# Patient Record
Sex: Female | Born: 1965 | Race: White | Hispanic: No | Marital: Married | Smoking: Never smoker
Health system: Southern US, Community
[De-identification: ages and names within clinical notes are randomized; demographics above are authoritative.]

## PROBLEM LIST (undated history)

## (undated) DIAGNOSIS — E785 Hyperlipidemia, unspecified: Secondary | ICD-10-CM

## (undated) DIAGNOSIS — F411 Generalized anxiety disorder: Secondary | ICD-10-CM

## (undated) DIAGNOSIS — Z309 Encounter for contraceptive management, unspecified: Secondary | ICD-10-CM

## (undated) DIAGNOSIS — B9734 Human T-cell lymphotrophic virus, type II [HTLV-II] as the cause of diseases classified elsewhere: Secondary | ICD-10-CM

## (undated) HISTORY — DX: Hyperlipidemia, unspecified: E78.5

## (undated) HISTORY — DX: Generalized anxiety disorder: F41.1

## (undated) HISTORY — DX: Encounter for contraceptive management, unspecified: Z30.9

## (undated) HISTORY — DX: Human T-cell lymphotrophic virus, type II (HTLV-II) as the cause of diseases classified elsewhere: B97.34

---

## 1998-02-08 ENCOUNTER — Other Ambulatory Visit: Admission: RE | Admit: 1998-02-08 | Discharge: 1998-02-08 | Payer: Self-pay | Admitting: Obstetrics and Gynecology

## 1998-09-11 ENCOUNTER — Inpatient Hospital Stay (HOSPITAL_COMMUNITY): Admission: AD | Admit: 1998-09-11 | Discharge: 1998-09-14 | Payer: Self-pay | Admitting: Obstetrics and Gynecology

## 1999-06-05 ENCOUNTER — Other Ambulatory Visit: Admission: RE | Admit: 1999-06-05 | Discharge: 1999-06-05 | Payer: Self-pay | Admitting: Obstetrics and Gynecology

## 2000-06-11 ENCOUNTER — Other Ambulatory Visit: Admission: RE | Admit: 2000-06-11 | Discharge: 2000-06-11 | Payer: Self-pay | Admitting: Obstetrics and Gynecology

## 2001-03-16 ENCOUNTER — Other Ambulatory Visit: Admission: RE | Admit: 2001-03-16 | Discharge: 2001-03-16 | Payer: Self-pay | Admitting: Obstetrics and Gynecology

## 2001-05-14 ENCOUNTER — Encounter: Payer: Self-pay | Admitting: Obstetrics and Gynecology

## 2001-05-14 ENCOUNTER — Ambulatory Visit (HOSPITAL_COMMUNITY): Admission: RE | Admit: 2001-05-14 | Discharge: 2001-05-14 | Payer: Self-pay | Admitting: Obstetrics and Gynecology

## 2001-09-27 ENCOUNTER — Inpatient Hospital Stay (HOSPITAL_COMMUNITY): Admission: RE | Admit: 2001-09-27 | Discharge: 2001-09-30 | Payer: Self-pay | Admitting: Obstetrics and Gynecology

## 2001-10-01 ENCOUNTER — Encounter: Admission: RE | Admit: 2001-10-01 | Discharge: 2001-10-31 | Payer: Self-pay | Admitting: Obstetrics and Gynecology

## 2002-01-12 ENCOUNTER — Encounter (HOSPITAL_BASED_OUTPATIENT_CLINIC_OR_DEPARTMENT_OTHER): Payer: Self-pay | Admitting: General Surgery

## 2002-01-17 ENCOUNTER — Ambulatory Visit (HOSPITAL_COMMUNITY): Admission: RE | Admit: 2002-01-17 | Discharge: 2002-01-18 | Payer: Self-pay | Admitting: General Surgery

## 2002-03-29 ENCOUNTER — Other Ambulatory Visit: Admission: RE | Admit: 2002-03-29 | Discharge: 2002-03-29 | Payer: Self-pay | Admitting: Obstetrics and Gynecology

## 2003-05-17 ENCOUNTER — Other Ambulatory Visit: Admission: RE | Admit: 2003-05-17 | Discharge: 2003-05-17 | Payer: Self-pay | Admitting: *Deleted

## 2004-06-12 ENCOUNTER — Other Ambulatory Visit: Admission: RE | Admit: 2004-06-12 | Discharge: 2004-06-12 | Payer: Self-pay | Admitting: Obstetrics and Gynecology

## 2005-07-31 ENCOUNTER — Other Ambulatory Visit: Admission: RE | Admit: 2005-07-31 | Discharge: 2005-07-31 | Payer: Self-pay | Admitting: Obstetrics and Gynecology

## 2006-08-28 ENCOUNTER — Other Ambulatory Visit: Admission: RE | Admit: 2006-08-28 | Discharge: 2006-08-28 | Payer: Self-pay | Admitting: Obstetrics and Gynecology

## 2008-04-03 ENCOUNTER — Ambulatory Visit: Payer: Self-pay | Admitting: Infectious Disease

## 2008-04-03 DIAGNOSIS — E785 Hyperlipidemia, unspecified: Secondary | ICD-10-CM | POA: Insufficient documentation

## 2008-04-03 DIAGNOSIS — F411 Generalized anxiety disorder: Secondary | ICD-10-CM | POA: Insufficient documentation

## 2008-04-03 DIAGNOSIS — B9734 Human T-cell lymphotrophic virus, type II [HTLV-II] as the cause of diseases classified elsewhere: Secondary | ICD-10-CM

## 2008-09-02 ENCOUNTER — Emergency Department (HOSPITAL_COMMUNITY): Admission: EM | Admit: 2008-09-02 | Discharge: 2008-09-02 | Payer: Self-pay | Admitting: Emergency Medicine

## 2009-09-02 IMAGING — US US TRANSVAGINAL NON-OB
1 series · 14 of 25 positions shown · non-contrast
Comparison: None

CLINICAL DATA: Right-sided pelvic pain.

TRANSABDOMINAL AND TRANSVAGINAL ULTRASOUND OF PELVIS
DOPPLER ULTRASOUND OF OVARIES
TECHNIQUE: Both transabdominal and transvaginal ultrasound
examinations of the pelvis were performed including evaluation of
the uterus, ovaries, adnexal regions, and pelvic cul-de-sac. Color
and duplex Doppler ultrasound was utilized to evaluate blood flow
to the ovaries.

[Series 1: unknown · 0.30mm/px · 14 of 59 slices shown]
[im 1/59]
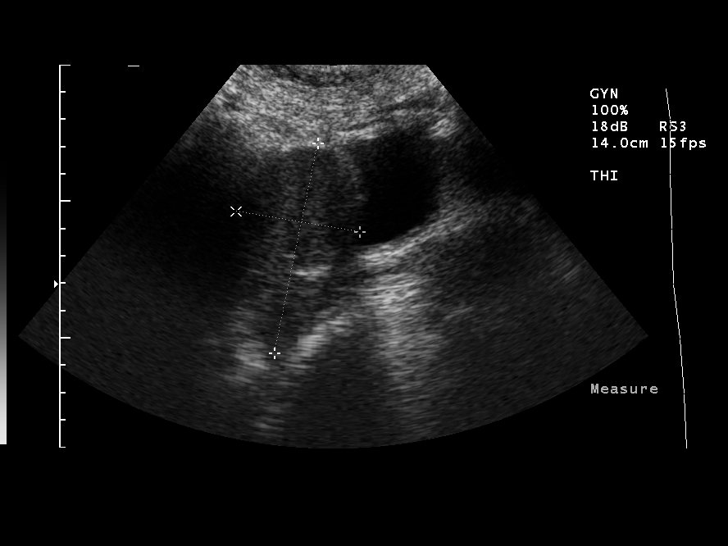
[im 5/59]
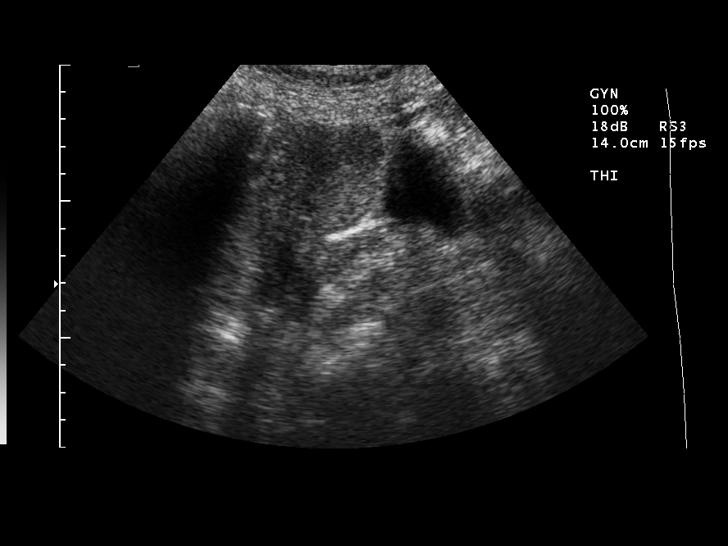
[im 10/59]
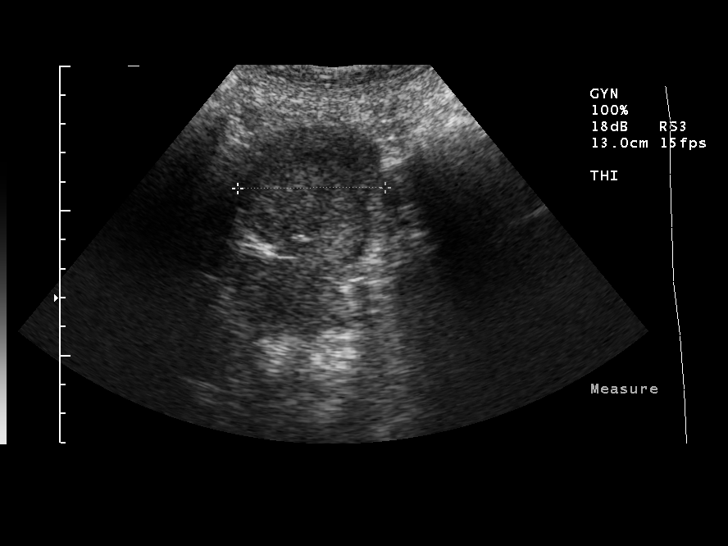
[im 15/59]
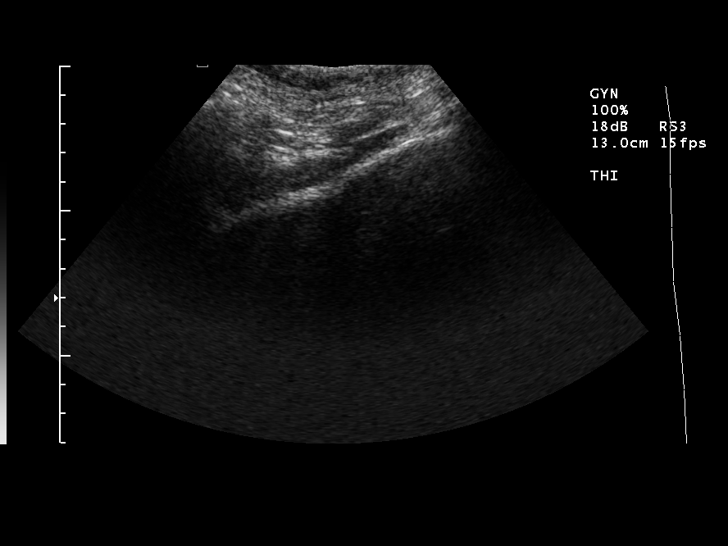
[im 20/59]
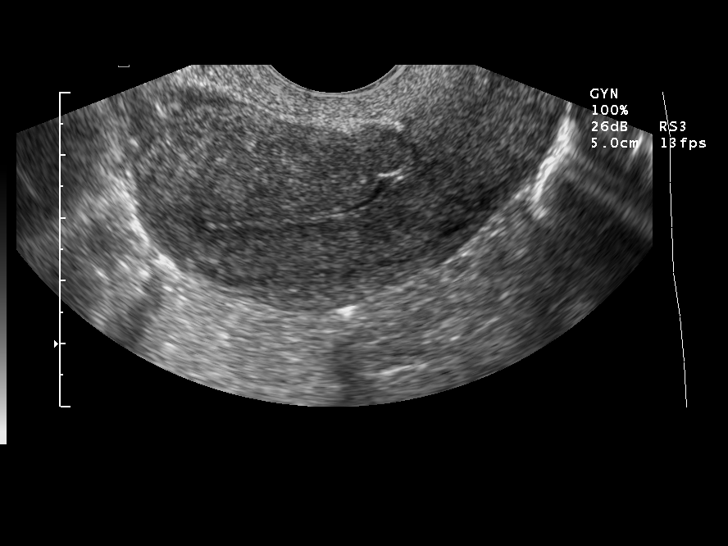
[im 22/59]
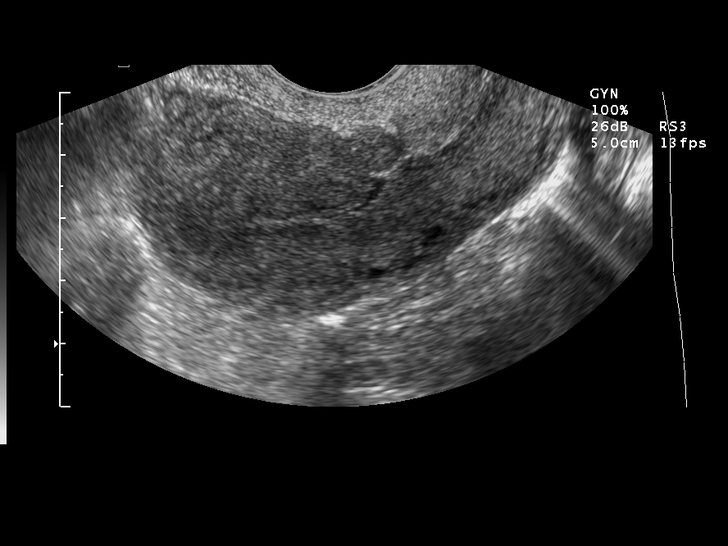
[im 27/59]
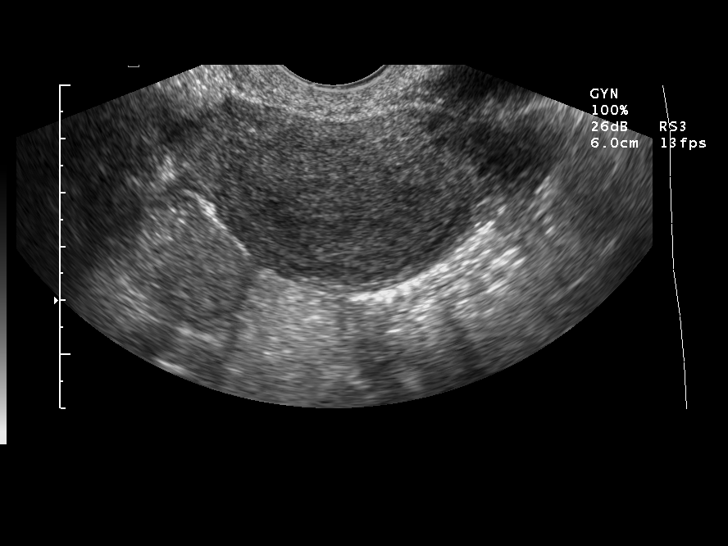
[im 32/59]
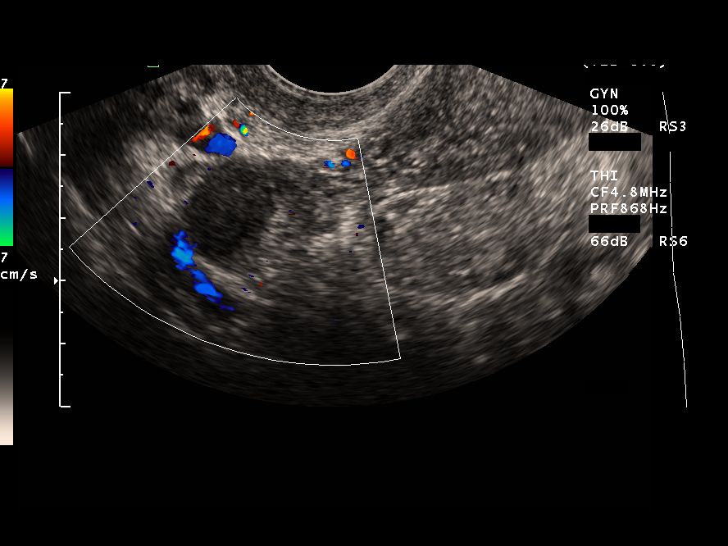
[im 37/59]
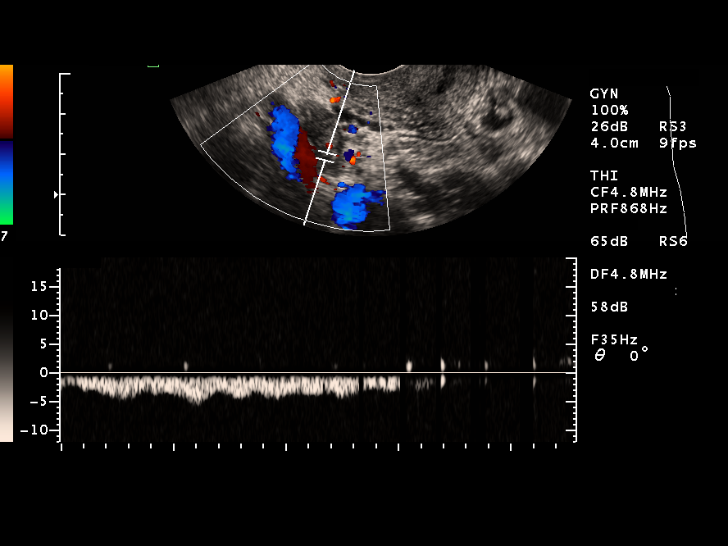
[im 39/59]
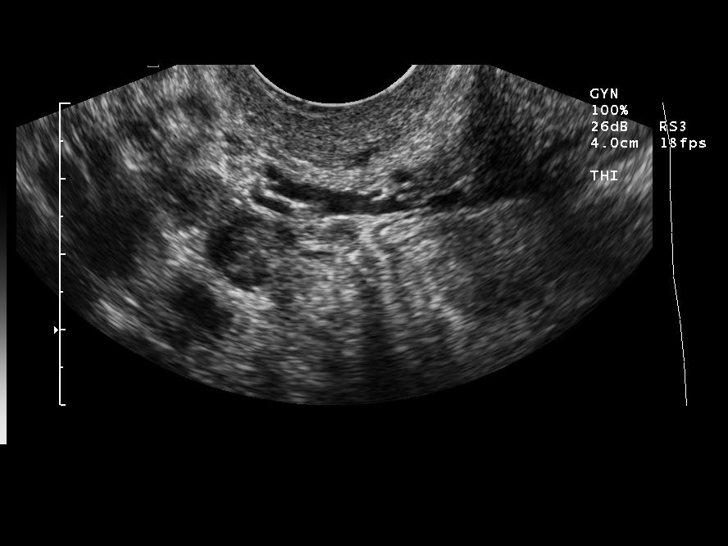
[im 44/59]
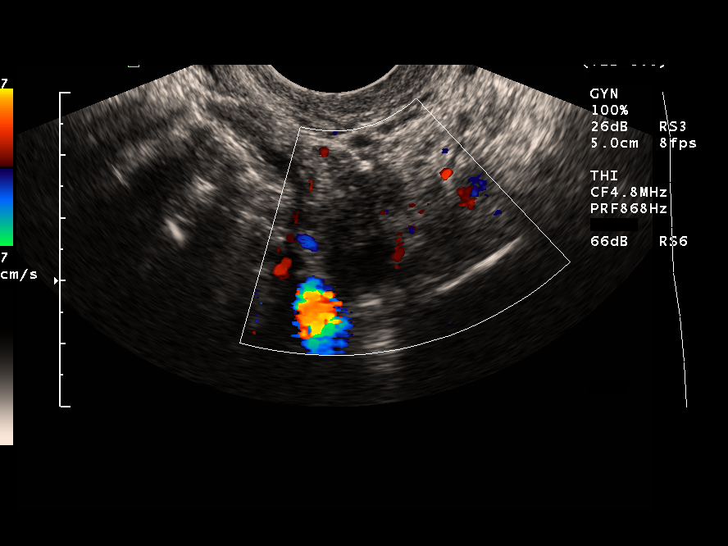
[im 49/59]
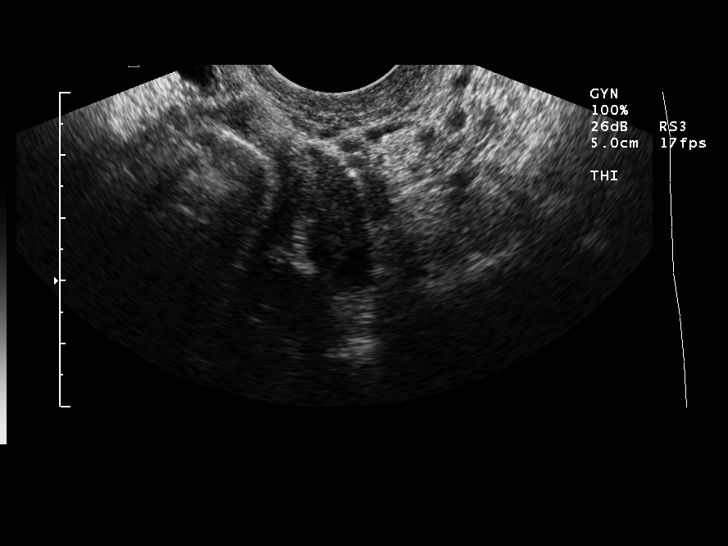
[im 54/59]
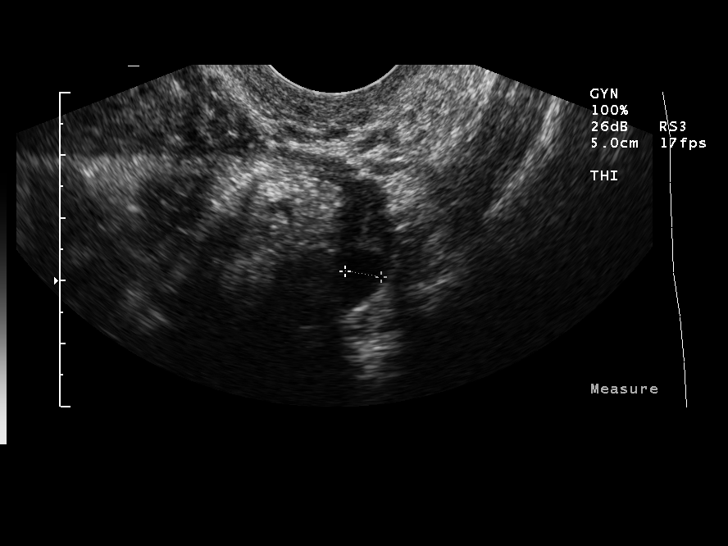
[im 59/59]
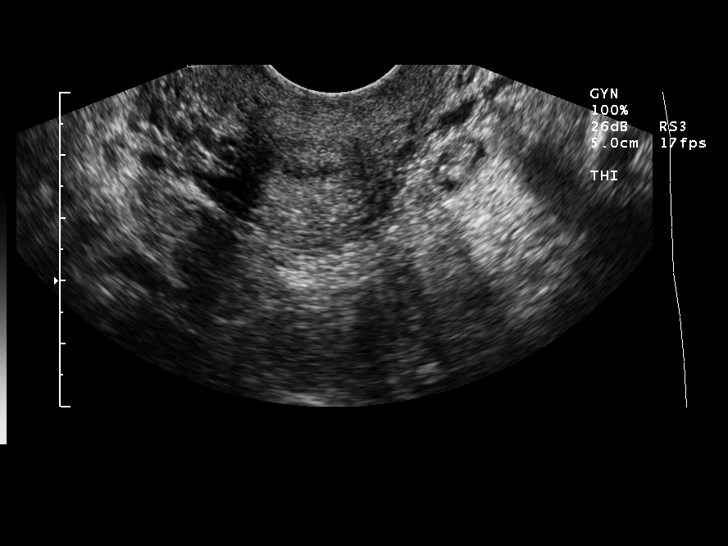

[14 of 25 positions shown; findings below may reference images not displayed]

FINDINGS: The uterus is anteverted measuring 8 x 4.5 x 5.1 cm.
No focal uterine lesions are identified.
The endometrial stripe is homogeneous measuring 4 mm in greatest
diameter.

The ovaries bilaterally are normal in size and echogenicity.
Normal arterial and venous flow and Doppler are noted in both
ovaries.

There is no evidence of adnexal mass or free fluid.
IMPRESSION: Normal pelvic ultrasound - no evidence of ovarian torsion.

## 2011-02-28 NOTE — H&P (Signed)
[facility]  Patient:    Brenda Martinez, Brenda Martinez Visit Number: [id number] MRN: [id number]          Service Type: Attending:  Janine Limbo, M.D. Dictated by:   Janine Limbo, M.D. Adm. Date:  09/27/01                           History and Physical  HISTORY OF PRESENT ILLNESS:   Ms. Lupinacci is a 45 year old female, gravida 2, para 1-0-0-1, who presents at [redacted] weeks gestation (EDC is October 12, 2001) for repeat cesarean section.  The patient has been followed at [facility] for this pregnancy that has been complicated by the fact that she has had a prior cesarean section.  Her age is actually greater than 35 as well.  OBSTETRICAL HISTORY:          In November 1999, the patient had a 7 pound 13 ounce female infant at 41-1/[redacted] weeks gestation by way of cesarean section.  DRUG ALLERGIES:               None known.  PAST MEDICAL HISTORY:         The patient denies hypertension and diabetes. She was told that she had gastroesophageal reflux disease in the past, but is not currently taking medication.  SOCIAL HISTORY:               The patient denies cigarette use, alcohol use and recreational drug use.  FAMILY HISTORY:               Noncontributory.  REVIEW OF SYSTEMS:            Normal pregnancy complaints.  PHYSICAL EXAMINATION:  VITAL SIGNS:                  Weight is 176 pounds.  HEENT:                        Within normal limits.  CHEST:                        Clear.  HEART:                        Regular rate and rhythm.  BREASTS:                      Without masses or tenderness.  ABDOMEN:                      Gravid with a fundal height of 36 cm.  EXTREMITIES:                  Within normal limits.  NEUROLOGIC EXAM:              Grossly normal.  PELVIC:                       The cervix is closed and long.  LABORATORY VALUES:            Blood type is O positive.  Antibody screen negative.  VDRL is  nonreactive.  Rubella is immune.  HBsAg is negative. Glucola screen is within normal limits.  Third trimester beta strep is negative.  ASSESSMENT:  1. A 38-week gestation.                               2. Prior cesarean section.                               3. Desires repeat cesarean section.  PLAN:                         The patient will undergo a repeat low transverse cesarean section.  She understands the indications for her procedure and she accepts the risks of, but not limited to, anesthetic complications, bleeding, infections and possible damage to the surrounding organs. Dictated by:   Janine Limbo, M.D. Attending:  Janine Limbo, M.D. DD:  09/27/01 TD:  09/27/01 Job: [id number]

## 2011-02-28 NOTE — Discharge Summary (Signed)
[facility]  Patient:    JIALI, LINNEY Visit Number: [id number] MRN: [id number]          Service Type: OBS Location: 910A 9139 01 Attending Physician:  Leonard Schwartz Dictated by:   Nigel Bridgeman, C.N.M. Admit Date:  09/27/2001 Discharge Date: 09/30/2001                             Discharge Summary  ADMISSION DIAGNOSES: 1. Term pregnancy. 2. Previous cesarean section. 3. Desires repeat.  DISCHARGE DIAGNOSES: 1. Term pregnancy. 2. Previous cesarean section. 3. Desires repeat.  PROCEDURE: 1. Repeat low transverse cesarean section. 2. Spinal anesthesia.  HOSPITAL COURSE:  Ms. Odonnel is a 45 year old, gravida 2, para 1-0-0-1, who was admitted at [redacted] weeks gestation for repeat cesarean section.  The patients pregnancy had been remarkable for previous cesarean section, advanced maternal age with amnio declined.  She was taken to the operating room on 09/27/01, where Dr. Stefano Gaul performed a repeat low transverse cesarean section under spinal anesthesia.  Findings were an 8 pound 10 ounce female by the name of Earlene Plater.  Apgars were 9 and 9.  There were normal uterus, tubes, and ovaries noted.  Estimated blood loss was 700 cc.  The patient tolerated the procedure well, and was taken to the recovery room in good condition.  The infant was taken to the full term nursery in good condition.  On postoperative day #1, the patient was doing well, she was breast-feeding, she was planning Micronor.  Vital signs were within normal limits.  She was afebrile.  Hemoglobin was 11.4.  White blood cell count was 9.1.  The rest of her hospital stay was essentially uncomplicated.  On the day of discharge, her incision was within normal limits.  There were some areas of irritation right at the point of insertion of her staples, but edges were well approximated and no evidence of infection was noted.  The staples were removed.  The area was cleaned with 50%  solution of hydrogen peroxide and sterile water, and Steri-Strips were applied.  The rest of the patients physical examination was within normal limits.  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  She was deemed to have received full benefit of her hospital stay, and was discharged home.  DISCHARGE INSTRUCTIONS:  Presented [place] OB handout.  DISCHARGE MEDICATIONS: 1. Motrin 600 mg p.o. q.6h. p.r.n. pain. 2. Tylox one or two p.o. q.3-4h. p.r.n. pain. 3. Micronor one p.o. q.d.  FOLLOWUP:  In six weeks at [facility]. Dictated by:   Nigel Bridgeman, C.N.M. Attending Physician:  Leonard Schwartz DD:  09/30/01 TD:  10/01/01 Job: [id number]

## 2011-02-28 NOTE — Op Note (Signed)
[place]  Patient:    Brenda Martinez, Brenda Martinez Visit Number: [id number] MRN: [id number]          Service Type: DSU Location: [facility] 2899 22 Attending Physician:  Sonda Primes Dictated by:   Mardene Celeste Lurene Shadow, M.D. Proc. Date: 01/17/02 Admit Date:  01/17/2002                             Operative Report  PREOPERATIVE DIAGNOSIS:  Ventral hernia.  POSTOPERATIVE DIAGNOSIS:  Ventral hernia.  OPERATION PERFORMED:  Repair of ventral hernia with mesh.  SURGEON:  Mardene Celeste. Lurene Shadow, M.D.  ASSISTANT:  Nurse.  ANESTHESIA:  General.  INDICATIONS FOR PROCEDURE:  The patient is a 45 year old woman who has had previous cesarean section in the past and presents now with a right lower quadrant mass which readily prolapses on standing.  No associated symptoms of bowel obstructions.  She presents now for repair of a ventral hernia which has occurred at the midline through the Pfannenstiel incision.  DESCRIPTION OF PROCEDURE:  Following the full discussion of the risks and benefits of surgery for repair of ventral hernia, the patient has asked and had all her questions answered and comes now to surgery having given full consent.  Following the induction of satisfactory anesthesia with the patient positioned supinely, the abdomen was routinely prepped and draped to be included in a sterile operative field.  The hernia has occurred in the midline just midway between the umbilicus and the pubic tubercle and with with some extension of it towards the right lower quadrant.  We approached this through the old Pfannenstiel incision deepening this through the skin and subcutaneous tissues carrying it down to the anterior rectus fascia.  The anterior rectus fascia was raised up above the rectus muscles and dissection carried down to the hernia.  There was prolapse of the omentum between the rectus fascia with a large hernia extending for about 4.5 cm down  through the midline.  The edge of the hernia was then grasped and cleared.  The hernial defect was then closed with a running suture of 2-0 Vicryl, then an onlay patch of polypropylene mesh was laid on top of the defect  and then sutured in place first by placing traction sutures in the four quadrants of the mesh and then using a #1 running Novofil suture to suture the mesh to the rectus muscle.  Hemostasis was then assured and then the anterior rectus fascia was then brought down over the repair and closed with a running suture of 2-0 Vicryl.  The subcutaneous tissues closed with a running 3-0 Vicryl suture and the skin closed with running 4-0 Monocryl suture and then reinforced with Steri-Strips.  Sterile dressing applied.  Anesthetic reversed.  Patient removed from the operating room to the recovery room in stable condition having tolerated the procedure well. Dictated by:   Mardene Celeste. Lurene Shadow, M.D. Attending Physician:  Sonda Primes DD:  01/17/02 TD:  01/17/02 Job: [phone] [id number]

## 2011-02-28 NOTE — Op Note (Signed)
[facility]  Patient:    Brenda Martinez, Brenda Martinez Visit Number: [id number] MRN: [id number]          Service Type: OBS Location: 910A 9139 01 Attending Physician:  Leonard Schwartz Dictated by:   Janine Limbo, M.D. Proc. Date: 09/27/01 Admit Date:  09/27/2001                             Operative Report  PREOPERATIVE DIAGNOSES:       1. Term intrauterine pregnancy.                               2. Prior cesarean section.                               3. Desires repeat cesarean section.  POSTOPERATIVE DIAGNOSES:      1. Term intrauterine pregnancy.                               2. Prior cesarean section.                               3. Desires repeat cesarean section.  PROCEDURE:                    Repeat low transverse cesarean section.  SURGEON:                      Janine Limbo, M.D.  FIRST ASSISTANT:              Vance Gather Duplantis, C.N.M.  ANESTHESIA:                   Spinal.  DISPOSITION:                  [name] is a 45 year old female gravida 2, para 1-0-0-1 who presents at [redacted] weeks gestation for repeat cesarean section. She understands the indications for her procedure and she accepts the risks of, but not limited to, anesthetic complications, bleeding, infections, and possible damage to the surrounding organs.  FINDINGS:                     An 8 pound 10 ounce female infant (name currently unknown) was delivered from a cephalic position.  Apgars were 9 at one minute and 9 at five minutes.  The uterus, fallopian tubes, and ovaries were normal for the gravid state.  PROCEDURE:                    The patient was taken to the operating room where a spinal anesthetic was given.  The patients abdomen was prepped with multiple layers of Betadine as was the perineum.  A Foley catheter was placed in the bladder.  The patient was sterilely draped.  A low transverse incision was made in the abdomen and carried sharply through  the subcutaneous tissue, the fascia, and the anterior peritoneum.  An incision was made in the lower uterine segment and extended transversely.  The fetal head was delivered without difficulty.  The mouth and nose were suctioned.  The remainder of the infant was then delivered.  The cord was  clamped and cut and the infant was handed to the awaiting pediatric team.  Routine cord blood studies were obtained.  Placenta was removed.  The uterine cavity was cleaned of amniotic fluid, clotted blood, and membranes.  The uterine incision was closed using a running locking suture of 2-0 Vicryl.  Hemostasis was adequate.  The pelvis was vigorously irrigated.  The anterior peritoneum and the abdominal musculature were reapproximated in the midline using 2-0 Vicryl.  The abdominal musculature, the fascia, and the subcutaneous layer were irrigated. The fascia was closed using a running suture 0 Vicryl followed by three interrupted sutures of 0 Vicryl.  The subcutaneous layer was closed using a running suture of 2-0 Vicryl.  The skin was reapproximated using skin staples. Sponge, needle, and instrument counts were correct on two occasions.  The estimated blood loss for the procedure was 700 cc.  The patient tolerated her procedure well.  She was taken to the recovery room in stable condition.  The infant was taken to the full-term nursery in stable condition. Dictated by:   Janine Limbo, M.D. Attending Physician:  Leonard Schwartz DD:  09/27/01 TD:  09/27/01 Job: [phone]

## 2011-07-15 LAB — POCT I-STAT, CHEM 8
BUN: 10
Calcium, Ion: 1.2
Chloride: 106
Creatinine, Ser: 1
Glucose, Bld: 86
HCT: 46
Hemoglobin: 15.6 — ABNORMAL HIGH
Potassium: 4
Sodium: 140
TCO2: 25

## 2011-07-15 LAB — DIFFERENTIAL
Basophils Absolute: 0.1
Basophils Relative: 1
Eosinophils Absolute: 0.1
Eosinophils Relative: 1
Lymphocytes Relative: 28
Lymphs Abs: 2.1
Monocytes Absolute: 0.4
Monocytes Relative: 6
Neutro Abs: 4.7
Neutrophils Relative %: 64

## 2011-07-15 LAB — CBC
HCT: 45.7
Hemoglobin: 15.1 — ABNORMAL HIGH
MCHC: 33.1
MCV: 91.3
Platelets: 280
RBC: 5
RDW: 12.3
WBC: 7.4

## 2011-07-15 LAB — URINE MICROSCOPIC-ADD ON

## 2011-07-15 LAB — GC/CHLAMYDIA PROBE AMP, GENITAL
Chlamydia, DNA Probe: NEGATIVE
GC Probe Amp, Genital: NEGATIVE

## 2011-07-15 LAB — URINALYSIS, ROUTINE W REFLEX MICROSCOPIC
Bilirubin Urine: NEGATIVE
Glucose, UA: NEGATIVE
Hgb urine dipstick: NEGATIVE
Ketones, ur: NEGATIVE
Nitrite: NEGATIVE
Protein, ur: NEGATIVE
Specific Gravity, Urine: 1.024
Urobilinogen, UA: 0.2
pH: 5.5

## 2011-07-15 LAB — PREGNANCY, URINE: Preg Test, Ur: NEGATIVE

## 2011-07-15 LAB — WET PREP, GENITAL
Trich, Wet Prep: NONE SEEN
WBC, Wet Prep HPF POC: NONE SEEN
Yeast Wet Prep HPF POC: NONE SEEN

## 2012-02-17 ENCOUNTER — Encounter: Payer: Self-pay | Admitting: Obstetrics and Gynecology

## 2012-02-17 ENCOUNTER — Ambulatory Visit (INDEPENDENT_AMBULATORY_CARE_PROVIDER_SITE_OTHER): Payer: 59 | Admitting: Obstetrics and Gynecology

## 2012-02-17 VITALS — BP 128/78 | Temp 99.1°F | Resp 16 | Ht 69.0 in | Wt 169.0 lb

## 2012-02-17 DIAGNOSIS — Z309 Encounter for contraceptive management, unspecified: Secondary | ICD-10-CM | POA: Insufficient documentation

## 2012-02-17 DIAGNOSIS — Z3049 Encounter for surveillance of other contraceptives: Secondary | ICD-10-CM

## 2012-02-17 DIAGNOSIS — Z01419 Encounter for gynecological examination (general) (routine) without abnormal findings: Secondary | ICD-10-CM

## 2012-02-17 MED ORDER — LEVONORGESTREL-ETHINYL ESTRAD 0.15-30 MG-MCG PO TABS: 1.0000 | ORAL_TABLET | Freq: Every day | ORAL | Status: DC

## 2012-02-17 NOTE — Progress Notes (Signed)
Allergies: NKDA Contraception: Nordette Gravida: 2 Para: 2 Primary Doctor: Dr. Beverly Milch EXAM  Regular Periods yes   Monthly Breast Ex. yes Tetanus<10 years yes NI. Bladder Function yes Daily BM's yes Healthy Diet yes Calcium/Multi-vitamin yes Mammogram yes Date: 2013-Normal Smoker no How much? Alcohol Abuse no How much? Substance Abuse no How much? Exercise yes How much? 3 x's a week Seatbelts yes Abuse at Home no Stressful Work no Sigmoid/Colonoscopy in past 5 years no  Medical problems this year: None  PMH: None  FMH: None  ABDOMINAL/PELVIC PAIN no  VAGINAL DISCHARGE no  IRREGULAR BLEEDING no  MENOPAUSAL SYMPTOMS no

## 2012-02-17 NOTE — Assessment & Plan Note (Signed)
Brenda Martinez

## 2012-02-17 NOTE — Progress Notes (Signed)
Subjective:    Brenda Martinez is a 46 y.o. female, No obstetric history on file., who presents for an annual exam.  No issues, wants to continue Nordette.  Sons are doing well (younger with special needs).    History   Social History  . Marital Status: Married    Spouse Name: N/A    Number of Children: N/A  . Years of Education: N/A   Social History Main Topics  . Smoking status: Never Smoker   . Smokeless tobacco: Never Used  . Alcohol Use: No  . Drug Use: No  . Sexually Active: Yes    Birth Control/ Protection: Pill   Other Topics Concern  . None   Social History Narrative  . None    Menstrual cycle:   LMP: Patient's last menstrual period was 01/27/2012.           Cycle: regular every month without intermenstrual spotting and Regular, monthly with normal flow and no severe dysmenorrha  The following portions of the patient's history were reviewed and updated as appropriate: allergies, current medications, past family history, past medical history, past social history, past surgical history and problem list.  Review of Systems Pertinent items are noted in HPI. Breast:Negative for breast lump,nipple discharge or nipple retraction Gastrointestinal: Negative for abdominal pain, change in bowel habits or rectal bleeding Urinary:negative   Objective:    BP 128/78  Temp 99.1 F (37.3 C)  Resp 16  Ht 5\' 9"  (1.753 m)  Wt 169 lb (76.658 kg)  BMI 24.96 kg/m2  LMP 01/27/2012    Weight:  Wt Readings from Last 1 Encounters:  02/17/12 169 lb (76.658 kg)          BMI: Body mass index is 24.96 kg/(m^2).  General Appearance: Alert, appropriate appearance for age. No acute distress HEENT: Grossly normal Neck / Thyroid: Supple, no masses, nodes or enlargement Lungs: clear to auscultation bilaterally Back: No CVA tenderness Breast Exam: No dimpling, nipple retraction or discharge. No masses or nodes. and No masses or nodes.No dimpling, nipple retraction or  discharge. Cardiovascular: Regular rate and rhythm. S1, S2, no murmur Gastrointestinal: Soft, non-tender, no masses or organomegaly Pelvic Exam: Vulva and vagina appear normal. Bimanual exam reveals normal uterus and adnexa. Rectovaginal: not indicated and normal rectal, no masses Lymphatic Exam: Non-palpable nodes in neck, clavicular, axillary, or inguinal regions Skin: no rash or abnormalities Neurologic: Normal gait and speech, no tremor  Psychiatric: Alert and oriented, appropriate affect.   Wet Prep:not applicable Urinalysis:not applicable UPT: Not done   Assessment:    Normal gyn exam  Contraceptive management--on Nordette   Plan:    Mammogram (had in January) Pap smear Return annually or prn STD screening: declined Contraception:oral contraceptives (estrogen/progesterone)--Rx Nordette sent electronically to [facility] for 3 month supply, refills x 1 year.      Hellena Pridgen, VICKIMD

## 2012-02-20 LAB — PAP IG W/ RFLX HPV ASCU

## 2012-03-25 ENCOUNTER — Ambulatory Visit: Payer: Self-pay | Admitting: Obstetrics and Gynecology

## 2013-09-26 ENCOUNTER — Encounter: Payer: Self-pay | Admitting: Interventional Cardiology

## 2013-09-26 ENCOUNTER — Encounter: Payer: Self-pay | Admitting: *Deleted

## 2013-09-27 ENCOUNTER — Encounter (INDEPENDENT_AMBULATORY_CARE_PROVIDER_SITE_OTHER): Payer: Self-pay

## 2013-09-27 ENCOUNTER — Encounter: Payer: Self-pay | Admitting: Interventional Cardiology

## 2013-09-27 ENCOUNTER — Ambulatory Visit (INDEPENDENT_AMBULATORY_CARE_PROVIDER_SITE_OTHER): Payer: 59 | Admitting: Interventional Cardiology

## 2013-09-27 VITALS — BP 120/84 | HR 82 | Ht 69.0 in | Wt 174.0 lb

## 2013-09-27 DIAGNOSIS — Z0389 Encounter for observation for other suspected diseases and conditions ruled out: Secondary | ICD-10-CM

## 2013-09-27 DIAGNOSIS — Z8249 Family history of ischemic heart disease and other diseases of the circulatory system: Secondary | ICD-10-CM | POA: Insufficient documentation

## 2013-09-27 NOTE — Patient Instructions (Signed)
Your physician has requested that you have an exercise tolerance test. For further information please visit [email or web address]. Please also follow instruction sheet, as given.   

## 2013-09-27 NOTE — Progress Notes (Signed)
Patient ID: Brenda Martinez, female   DOB: 23-Aug-1966, 47 y.o.   MRN: [id number]     Patient ID: Brenda Martinez MRN: [id number] DOB/AGE: 1965/12/28 47 y.o.   Referring Physician Dr. Herb Grays, Haze Rushing FNP   Reason for Consultation Family h/o CAD  HPI:  47 y/o who has a family h/o CAD.  Father had CABG x 4 about 13 years ago.  He was 49 Years old.  He also had severe HTN and suffered a stroke.  Both of her grandfathers ad heart disease in their early 70s.  She has 2 sisters who have had any severe heart disease.   She walks regularly for exercise.  She also does some light cardio/weights.  No sx with that.   At the end of the summer, she had left leg pain  It can now be above her left knee.  She can have some throbbing as well.  She took some NSAIDs with some relief.    Current Outpatient Prescriptions  Medication Sig Dispense Refill  . atorvastatin (LIPITOR) 10 MG tablet Take 10 mg by mouth daily.       [name] buPROPion (WELLBUTRIN XL) 150 MG 24 hr tablet Take 150 mg by mouth daily.       [name] escitalopram (LEXAPRO) 20 MG tablet Take 20 mg by mouth daily.       . Levonorgestrel-Ethinyl Estrad (ALTAVERA PO) Take by mouth daily. Birth control      . Multiple Vitamin (MULTIVITAMIN) tablet Take 1 tablet by mouth daily.      [name] omeprazole (PRILOSEC) 40 MG capsule Take 40 mg by mouth daily.      . vitamin B-12 (CYANOCOBALAMIN) 250 MCG tablet Take 250 mcg by mouth daily.       No current facility-administered medications for this visit.   Past Medical History  Diagnosis Date  . HYPERLIPIDEMIA   . Contraceptive management   . 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 Other anxiety states     No family history on file.  History   Social History  . Marital Status: Married    Spouse Name: N/A    Number of Children: N/A  . Years of Education: N/A   Occupational History  . Not on file.   Social History Main Topics  . Smoking status: Never Smoker   . Smokeless tobacco: Never Used  .  Alcohol Use: No  . Drug Use: No  . Sexual Activity: Yes    Birth Control/ Protection: Pill   Other Topics Concern  . Not on file   Social History Narrative  . No narrative on file    No past surgical history on file.    (Not in a hospital admission)  Review of systems complete and found to be negative unless listed above .  No nausea, vomiting.  No fever chills, No focal weakness,  No palpitations.  Physical Exam: Filed Vitals:   09/27/13 0926  BP: 120/84  Pulse: 82    Weight: 174 lb (78.926 kg)  Physical exam:  [place]/AT EOMI No JVD, No carotid bruit RRR S1S2  No wheezing Soft. NT, nondistended No edema. 2+ dorsalis pedis pulses bilaterally No focal motor or sensory deficits Normal affect  Labs:   Lab Results  Component Value Date   WBC 7.4 09/02/2008   HGB 15.6* 09/02/2008   HCT 46.0 09/02/2008   MCV 91.3 09/02/2008   PLT 280 09/02/2008   No results found for this basename: NA,  K, CL, CO2, BUN, CREATININE, CALCIUM, LABALBU, PROT, BILITOT, ALKPHOS, ALT, AST, GLUCOSE,  in the last 168 hours No results found for this basename: CKTOTAL,  CKMB,  CKMBINDEX,  TROPONINI    No results found for this basename: CHOL   No results found for this basename: HDL   No results found for this basename: LDLCALC   No results found for this basename: TRIG   No results found for this basename: CHOLHDL   No results found for this basename: LDLDIRECT       EKG: Normal  ASSESSMENT AND PLAN:   Family history of early CAD: Father with bypass surgery in his 59s. Both grandfathers with heart disease. We'll plan for exercise treadmill test to establish baseline.  She has not had any symptoms related to the cardiovascular system. Continue preventative therapy including lipid-lowering therapy. Continue diet control. Continue regular exercise.  Left Leg pain: She has a normal, left dorsalis pedis pulse.  I do not think her pain is vascular. It does not follow a pattern of  claudication.  Hyperlipidemia: Continue atorvastatin. Continue careful diet.  Signed:    Fredric Mare, MD, [facility] 09/27/2013, 10:07 AM

## 2013-10-05 ENCOUNTER — Ambulatory Visit (HOSPITAL_COMMUNITY)
Admission: RE | Admit: 2013-10-05 | Discharge: 2013-10-05 | Disposition: A | Discharge status: Home / Self Care | Payer: 59 | Source: Ambulatory Visit | Attending: Interventional Cardiology | Admitting: Interventional Cardiology

## 2013-10-05 DIAGNOSIS — Z8249 Family history of ischemic heart disease and other diseases of the circulatory system: Secondary | ICD-10-CM | POA: Insufficient documentation

## 2013-10-05 DIAGNOSIS — I10 Essential (primary) hypertension: Secondary | ICD-10-CM | POA: Insufficient documentation

## 2014-03-30 DIAGNOSIS — K2100 Gastro-esophageal reflux disease with esophagitis, without bleeding: Secondary | ICD-10-CM | POA: Insufficient documentation

## 2016-01-27 ENCOUNTER — Encounter (HOSPITAL_COMMUNITY): Payer: Self-pay | Admitting: Emergency Medicine

## 2016-01-27 ENCOUNTER — Emergency Department (HOSPITAL_COMMUNITY): Payer: 59

## 2016-01-27 ENCOUNTER — Emergency Department (HOSPITAL_COMMUNITY)
Admission: EM | Admit: 2016-01-27 | Discharge: 2016-01-27 | Disposition: A | Discharge status: Home / Self Care | Payer: 59 | Attending: Emergency Medicine | Admitting: Emergency Medicine

## 2016-01-27 DIAGNOSIS — Z793 Long term (current) use of hormonal contraceptives: Secondary | ICD-10-CM | POA: Insufficient documentation

## 2016-01-27 DIAGNOSIS — Z79899 Other long term (current) drug therapy: Secondary | ICD-10-CM | POA: Insufficient documentation

## 2016-01-27 DIAGNOSIS — R079 Chest pain, unspecified: Secondary | ICD-10-CM | POA: Diagnosis not present

## 2016-01-27 DIAGNOSIS — Z8619 Personal history of other infectious and parasitic diseases: Secondary | ICD-10-CM | POA: Diagnosis not present

## 2016-01-27 DIAGNOSIS — K219 Gastro-esophageal reflux disease without esophagitis: Secondary | ICD-10-CM | POA: Diagnosis not present

## 2016-01-27 DIAGNOSIS — E785 Hyperlipidemia, unspecified: Secondary | ICD-10-CM | POA: Insufficient documentation

## 2016-01-27 DIAGNOSIS — F419 Anxiety disorder, unspecified: Secondary | ICD-10-CM | POA: Diagnosis not present

## 2016-01-27 LAB — CBC
HEMATOCRIT: 41 % (ref 36.0–46.0)
HEMOGLOBIN: 13.2 g/dL (ref 12.0–15.0)
MCH: 28.9 pg (ref 26.0–34.0)
MCHC: 32.2 g/dL (ref 30.0–36.0)
MCV: 89.9 fL (ref 78.0–100.0)
Platelets: 214 10*3/uL (ref 150–400)
RBC: 4.56 MIL/uL (ref 3.87–5.11)
RDW: 12.7 % (ref 11.5–15.5)
WBC: 8.4 10*3/uL (ref 4.0–10.5)

## 2016-01-27 LAB — I-STAT TROPONIN, ED
TROPONIN I, POC: 0 ng/mL (ref 0.00–0.08)
Troponin i, poc: 0 ng/mL (ref 0.00–0.08)

## 2016-01-27 LAB — BASIC METABOLIC PANEL
Anion gap: 11 (ref 5–15)
BUN: 14 mg/dL (ref 6–20)
CHLORIDE: 108 mmol/L (ref 101–111)
CO2: 21 mmol/L — AB (ref 22–32)
Calcium: 9 mg/dL (ref 8.9–10.3)
Creatinine, Ser: 0.79 mg/dL (ref 0.44–1.00)
GFR calc Af Amer: >60 mL/min (ref >60)
GFR calc non Af Amer: >60 mL/min (ref >60)
GLUCOSE: 115 mg/dL — AB (ref 65–99)
POTASSIUM: 4 mmol/L (ref 3.5–5.1)
SODIUM: 140 mmol/L (ref 135–145)

## 2016-01-27 MED ORDER — ALUM & MAG HYDROXIDE-SIMETH 200-200-20 MG/5ML PO SUSP
30.0000 mL | Freq: Once | ORAL | Status: AC
  Administered 2016-01-27: 30 mL via ORAL
  Filled 2016-01-27: qty 30

## 2016-01-27 MED ORDER — RANITIDINE HCL 150 MG/10ML PO SYRP
300.0000 mg | ORAL_SOLUTION | Freq: Once | ORAL | Status: AC
  Administered 2016-01-27: 300 mg via ORAL
  Filled 2016-01-27: qty 20

## 2016-01-27 MED ORDER — METOCLOPRAMIDE HCL 10 MG PO TABS
10.0000 mg | ORAL_TABLET | Freq: Once | ORAL | Status: AC
  Administered 2016-01-27: 10 mg via ORAL
  Filled 2016-01-27: qty 1

## 2016-01-27 NOTE — Discharge Instructions (Signed)
Nonspecific Chest Pain Ms. Fromer, your blood work and EKG today were normal.  Please see your primary care physician within 3 days for close follow up. If symptoms worsen, come back to the ED immediately. Thank you.    It is often hard to find the cause of chest pain. There is always a chance that your pain could be related to something serious, such as a heart attack or a blood clot in your lungs. Chest pain can also be caused by conditions that are not life-threatening. If you have chest pain, it is very important to follow up with your doctor.  HOME CARE  If you were prescribed an antibiotic medicine, finish it all even if you start to feel better.  Avoid any activities that cause chest pain.  Do not use any tobacco products, including cigarettes, chewing tobacco, or electronic cigarettes. If you need help quitting, ask your doctor.  Do not drink alcohol.  Take medicines only as told by your doctor.  Keep all follow-up visits as told by your doctor. This is important. This includes any further testing if your chest pain does not go away.  Your doctor may tell you to keep your head raised (elevated) while you sleep.  Make lifestyle changes as told by your doctor. These may include:  Getting regular exercise. Ask your doctor to suggest some activities that are safe for you.  Eating a heart-healthy diet. Your doctor or a diet specialist (dietitian) can help you to learn healthy eating options.  Maintaining a healthy weight.  Managing diabetes, if necessary.  Reducing stress. GET HELP IF:  Your chest pain does not go away, even after treatment.  You have a rash with blisters on your chest.  You have a fever. GET HELP RIGHT AWAY IF:  Your chest pain is worse.  You have an increasing cough, or you cough up blood.  You have severe belly (abdominal) pain.  You feel extremely weak.  You pass out (faint).  You have chills.  You have sudden, unexplained chest  discomfort.  You have sudden, unexplained discomfort in your arms, back, neck, or jaw.  You have shortness of breath at any time.  You suddenly start to sweat, or your skin gets clammy.  You feel nauseous.  You vomit.  You suddenly feel light-headed or dizzy.  Your heart begins to beat quickly, or it feels like it is skipping beats. These symptoms may be an emergency. Do not wait to see if the symptoms will go away. Get medical help right away. Call your local emergency services (911 in the [place] Do not drive yourself to the hospital.   This information is not intended to replace advice given to you by your health care provider. Make sure you discuss any questions you have with your health care provider.   Document Released: 03/17/2008 Document Revised: 10/20/2014 Document Reviewed: 05/05/2014 Elsevier Interactive Patient Education [employer].

## 2016-01-27 NOTE — ED Provider Notes (Signed)
CSN: [id number]     Arrival date & time 01/27/16  0146 History   By signing my name below, I, Forrestine Him, attest that this documentation has been prepared under the direction and in the presence of Everlene Balls, MD.  Electronically Signed: Forrestine Him, ED Scribe. 01/27/2016. 2:03 AM.   Chief Complaint  Patient presents with  . Chest Pain   HPI  HPI Comments: AALIAYH BIDDULPH is a 50 y.o. female with a PMHx of reflux and hyperlipidemia who presents to the Emergency Department complaining of intermittent, ongoing, worsening L sided chest pain onset 6:00 PM this evening. Pain is described as burning. Discomfort is exacerbated with deep breathing without any alleviating factors. No OTC mediations attempted prior to arrival. However , 2 Nitro given en route to department without any relief. She denies any fever, chills, nausea, vomiting, or diaphoresis. Pt states symptoms feel different from discomfort associated with reflux. No recent long distance travel. She denies any recent surgeries. No known allergies to medications.  PCP: Florina Ou, MD    Past Medical History  Diagnosis Date  . HYPERLIPIDEMIA   . Contraceptive management   . 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 Other anxiety states    No past surgical history on file. Family History  Problem Relation Age of Onset  . Heart disease Father     CABG  . Heart attack Maternal Grandfather   . Heart attack Paternal Grandfather    Social History  Substance Use Topics  . Smoking status: Never Smoker   . Smokeless tobacco: Never Used  . Alcohol Use: No   OB History    No data available     Review of Systems  A complete 10 system review of systems was obtained and all systems are negative except as noted in the HPI and PMH.    Allergies  Review of patient's allergies indicates no known allergies.  Home Medications   Prior to Admission medications   Medication Sig Start Date End Date Taking? Authorizing Provider   atorvastatin (LIPITOR) 10 MG tablet Take 10 mg by mouth daily.  09/19/13   Historical Provider, MD  buPROPion (WELLBUTRIN XL) 150 MG 24 hr tablet Take 150 mg by mouth daily.  08/01/13   Historical Provider, MD  escitalopram (LEXAPRO) 20 MG tablet Take 20 mg by mouth daily.  09/18/13   Historical Provider, MD  Levonorgestrel-Ethinyl Estrad (ALTAVERA PO) Take by mouth daily. Birth control    Historical Provider, MD  Multiple Vitamin (MULTIVITAMIN) tablet Take 1 tablet by mouth daily.    Historical Provider, MD  omeprazole (PRILOSEC) 40 MG capsule Take 40 mg by mouth daily.    Historical Provider, MD  vitamin B-12 (CYANOCOBALAMIN) 250 MCG tablet Take 250 mcg by mouth daily.    Historical Provider, MD   Triage Vitals: Pulse 84  Temp(Src) 99.3 F (37.4 C) (Oral)  Ht 5\' 9"  (1.753 m)  Wt 160 lb (72.576 kg)  BMI 23.62 kg/m2  SpO2 97%   Physical Exam  Constitutional: She is oriented to person, place, and time. She appears well-developed and well-nourished. No distress.  HENT:  Head: Normocephalic and atraumatic.  Nose: Nose normal.  Mouth/Throat: Oropharynx is clear and moist. No oropharyngeal exudate.  Eyes: Conjunctivae and EOM are normal. Pupils are equal, round, and reactive to light. No scleral icterus.  Neck: Normal range of motion. Neck supple. No JVD present. No tracheal deviation present. No thyromegaly present.  Cardiovascular: Normal rate, regular rhythm and  normal heart sounds.  Exam reveals no gallop and no friction rub.   No murmur heard. Pulmonary/Chest: Effort normal and breath sounds normal. No respiratory distress. She has no wheezes. She exhibits no tenderness.  Abdominal: Soft. Bowel sounds are normal. She exhibits no distension and no mass. There is no tenderness. There is no rebound and no guarding.  Musculoskeletal: Normal range of motion. She exhibits no edema or tenderness.  Lymphadenopathy:    She has no cervical adenopathy.  Neurological: She is alert and oriented to  person, place, and time. No cranial nerve deficit. She exhibits normal muscle tone.  Skin: Skin is warm and dry. No rash noted. No erythema. No pallor.  Nursing note and vitals reviewed.   ED Course  Procedures (including critical care time)  DIAGNOSTIC STUDIES: Oxygen Saturation is 97% on RA, adequate by my interpretation.    COORDINATION OF CARE: 2:00 AM- Will order imaging, blood work, and EKG. Discussed treatment plan with pt at bedside and pt agreed to plan.     Labs Review Labs Reviewed  BASIC METABOLIC PANEL - Abnormal; Notable for the following:    CO2 21 (*)    Glucose, Bld 115 (*)    All other components within normal limits  CBC  I-STAT TROPOININ, ED  Randolm Idol, ED    Imaging Review Dg Chest 2 View  01/27/2016  CLINICAL DATA:  Initial evaluation for acute chest pain. EXAM: CHEST  2 VIEW COMPARISON:  None. FINDINGS: The cardiac and mediastinal silhouettes are within normal limits. Trach air column midline and patent. Lungs are mildly hypoinflated. Mild bibasilar atelectasis. No consolidative airspace disease. No pulmonary edema or pleural effusion. No pneumothorax. Incidental note made of an azygos lobe. No acute osseous abnormality. IMPRESSION: Shallow lung inflation with mild bibasilar atelectasis. No other active cardiopulmonary disease. Electronically Signed   By: Jeannine Boga M.D.   On: 01/27/2016 03:10   I have personally reviewed and evaluated these images and lab results as part of my medical decision-making.   EKG Interpretation   Date/Time:  Sunday January 27 2016 05:20:04 EDT Ventricular Rate:  83 PR Interval:  126 QRS Duration: 88 QT Interval:  371 QTC Calculation: 436 R Axis:   35 Text Interpretation:  Sinus rhythm No significant change since last  tracing Confirmed by Glynn Octave [phone]) on 01/27/2016 5:36:14 AM      MDM   Final diagnoses:  None    Patient presents emergency department for chest pain. She is low risk  for ACS. Will evaluate with heart score and 2 sets of troponins. Nitroglycerin did not help her pain get better. There is no exertional component to her pain. Heart score is currently 1 for her age. Troponin and EKG are negative.  5:43 AM Repeat EKG is unchanged and troponin is negative.  HEART score remains 1.  She appears well and in NAD.  PCP fu advised within 3 days.  VS remain within her normal limits and she is safe for DC.   I personally performed the services described in this documentation, which was scribed in my presence. The recorded information has been reviewed and is accurate.     Everlene Balls, MD 01/27/16 [phone]

## 2016-01-27 NOTE — ED Notes (Signed)
Pt. States she started having left sided chest pain last night around 6 pm at dinner. Pt. States if first started as burning pain. Pt. Now describes pain as sharp and constant. Pt. States she feels like she cant take a full breath. Pt. Had 2 nitro tablets in route to ED. Pt. States nitro did not help. Pt. States she took aspirin before EMS arrived.

## 2016-10-17 DIAGNOSIS — Z1231 Encounter for screening mammogram for malignant neoplasm of breast: Secondary | ICD-10-CM | POA: Diagnosis not present

## 2016-11-14 DIAGNOSIS — K219 Gastro-esophageal reflux disease without esophagitis: Secondary | ICD-10-CM | POA: Diagnosis not present

## 2016-11-14 DIAGNOSIS — Z6824 Body mass index (BMI) 24.0-24.9, adult: Secondary | ICD-10-CM | POA: Diagnosis not present

## 2016-12-26 DIAGNOSIS — Z6823 Body mass index (BMI) 23.0-23.9, adult: Secondary | ICD-10-CM | POA: Diagnosis not present

## 2016-12-26 DIAGNOSIS — M79605 Pain in left leg: Secondary | ICD-10-CM | POA: Diagnosis not present

## 2017-01-26 IMAGING — CR DG CHEST 2V
2 series · 2 of 2 positions shown · non-contrast
Comparison: None.

CLINICAL DATA: Initial evaluation for acute chest pain.

EXAM:
CHEST  2 VIEW

[chest pa]
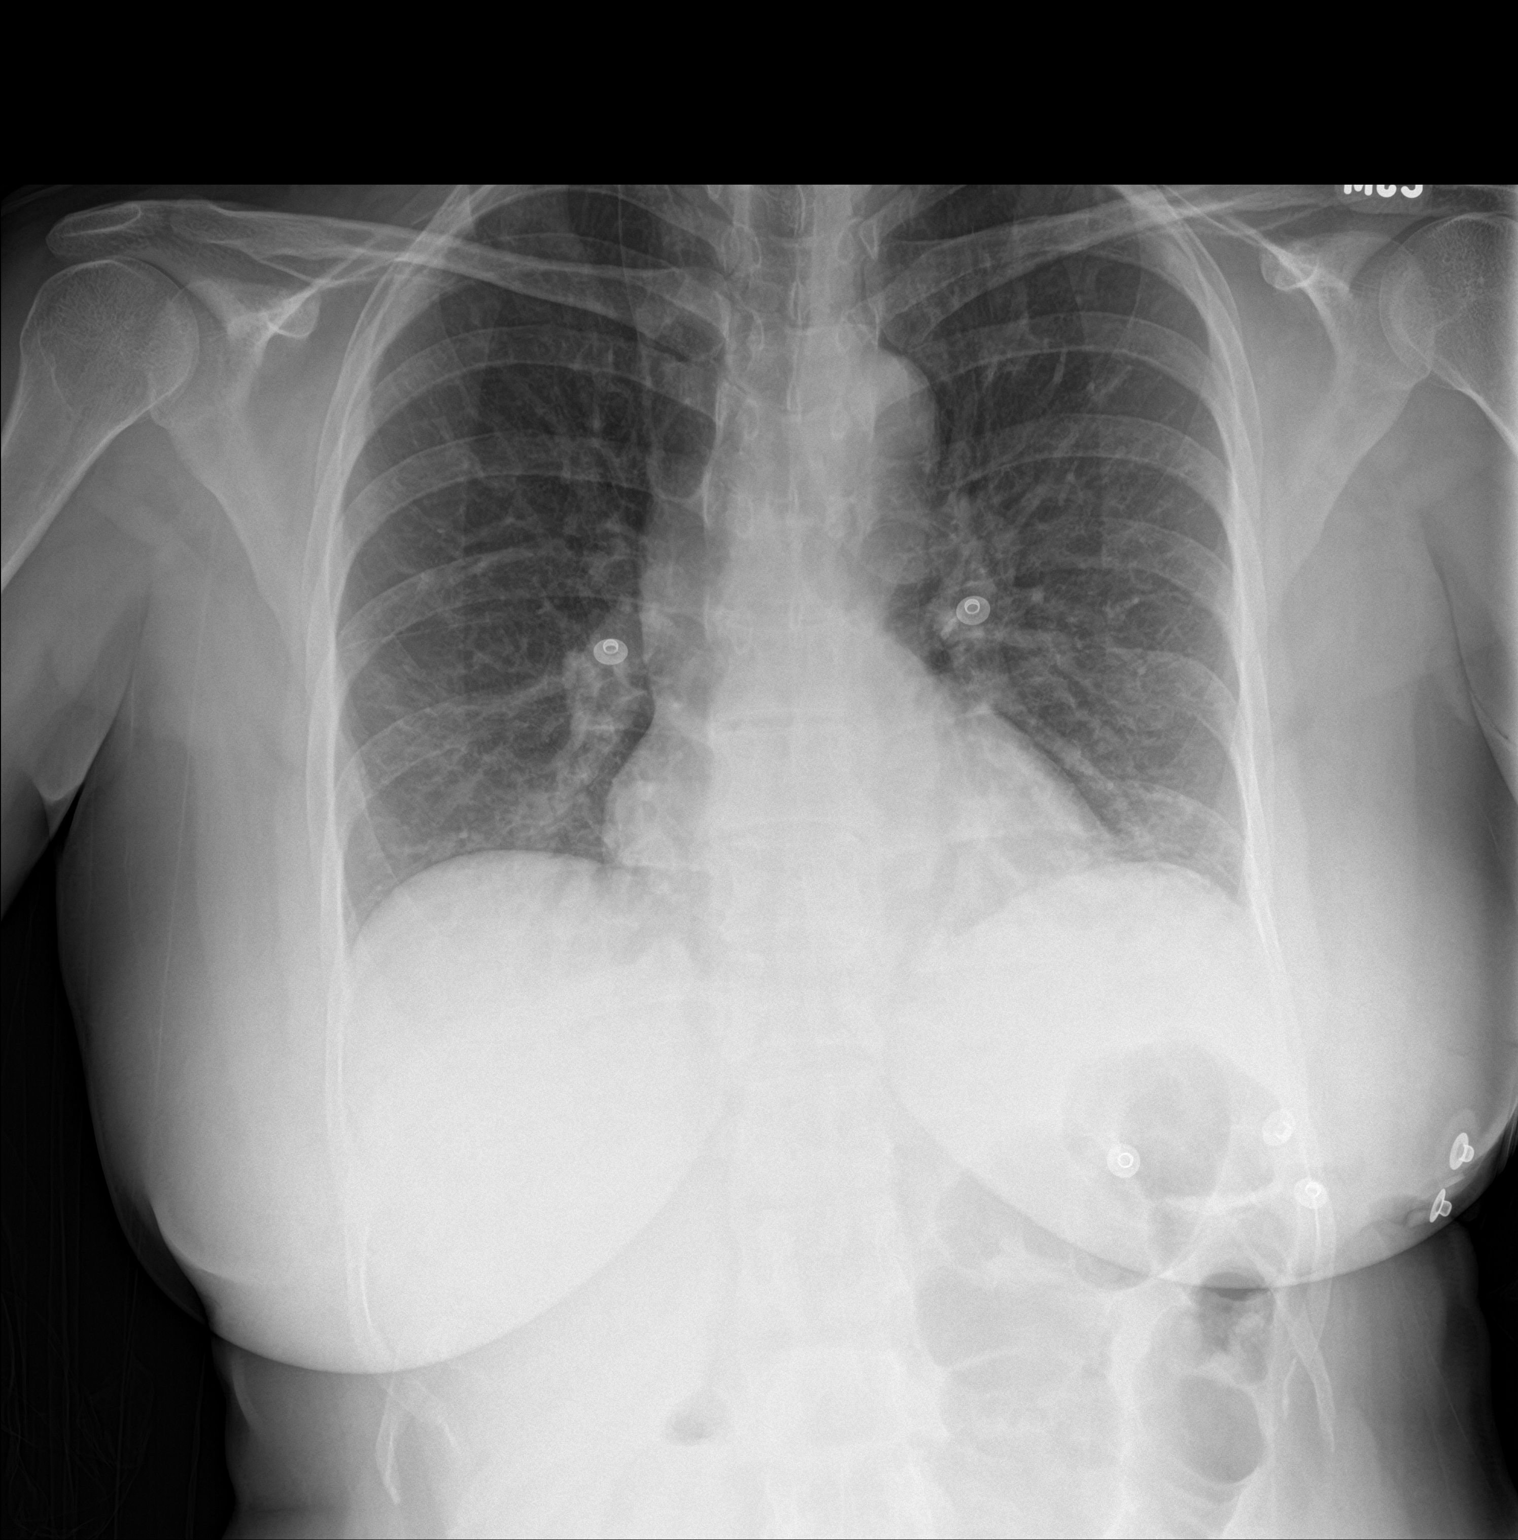

[chest lat]
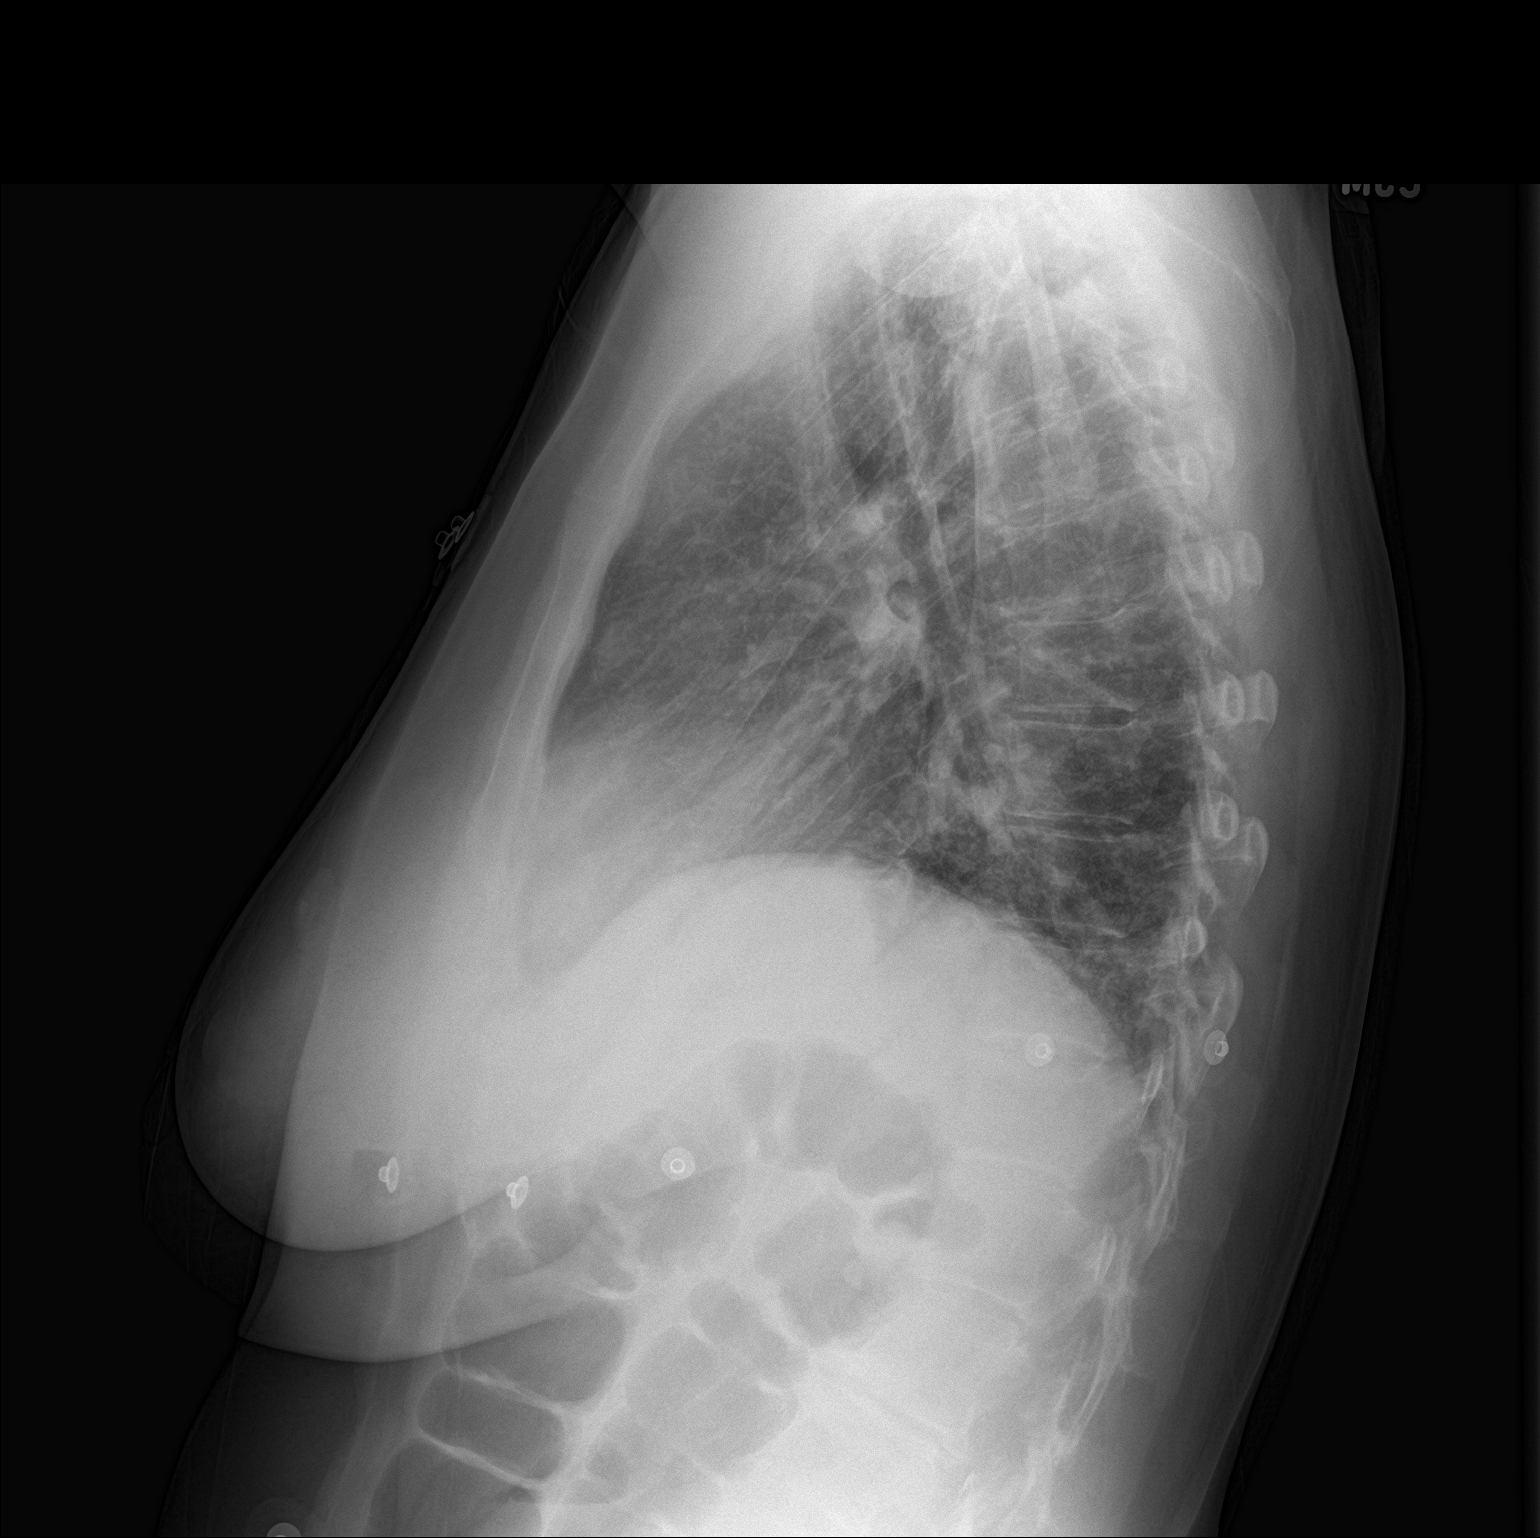

[2 of 2 positions shown; findings below may reference images not displayed]

FINDINGS: The cardiac and mediastinal silhouettes are within normal limits.
Trach air column midline and patent.

Lungs are mildly hypoinflated. Mild bibasilar atelectasis. No
consolidative airspace disease. No pulmonary edema or pleural
effusion. No pneumothorax. Incidental note made of an azygos lobe.

No acute osseous abnormality.
IMPRESSION: Shallow lung inflation with mild bibasilar atelectasis. No other
active cardiopulmonary disease.

## 2017-06-19 DIAGNOSIS — Z1283 Encounter for screening for malignant neoplasm of skin: Secondary | ICD-10-CM | POA: Diagnosis not present

## 2017-06-19 DIAGNOSIS — B070 Plantar wart: Secondary | ICD-10-CM | POA: Diagnosis not present

## 2017-07-17 DIAGNOSIS — J0110 Acute frontal sinusitis, unspecified: Secondary | ICD-10-CM | POA: Diagnosis not present

## 2017-08-14 DIAGNOSIS — Z0000 Encounter for general adult medical examination without abnormal findings: Secondary | ICD-10-CM | POA: Diagnosis not present

## 2017-08-17 DIAGNOSIS — Z01419 Encounter for gynecological examination (general) (routine) without abnormal findings: Secondary | ICD-10-CM | POA: Diagnosis not present

## 2017-08-17 DIAGNOSIS — Z118 Encounter for screening for other infectious and parasitic diseases: Secondary | ICD-10-CM | POA: Diagnosis not present

## 2017-08-17 DIAGNOSIS — Z23 Encounter for immunization: Secondary | ICD-10-CM | POA: Diagnosis not present

## 2017-08-17 DIAGNOSIS — Z0000 Encounter for general adult medical examination without abnormal findings: Secondary | ICD-10-CM | POA: Diagnosis not present

## 2017-08-25 DIAGNOSIS — N951 Menopausal and female climacteric states: Secondary | ICD-10-CM | POA: Diagnosis not present

## 2017-09-18 DIAGNOSIS — Z780 Asymptomatic menopausal state: Secondary | ICD-10-CM | POA: Diagnosis not present

## 2017-09-24 DIAGNOSIS — N951 Menopausal and female climacteric states: Secondary | ICD-10-CM | POA: Diagnosis not present

## 2017-09-24 DIAGNOSIS — K219 Gastro-esophageal reflux disease without esophagitis: Secondary | ICD-10-CM | POA: Diagnosis not present

## 2017-10-23 DIAGNOSIS — Z1231 Encounter for screening mammogram for malignant neoplasm of breast: Secondary | ICD-10-CM | POA: Diagnosis not present

## 2018-01-06 DIAGNOSIS — K219 Gastro-esophageal reflux disease without esophagitis: Secondary | ICD-10-CM | POA: Diagnosis not present

## 2018-02-05 DIAGNOSIS — L42 Pityriasis rosea: Secondary | ICD-10-CM | POA: Diagnosis not present

## 2018-02-05 DIAGNOSIS — Z6824 Body mass index (BMI) 24.0-24.9, adult: Secondary | ICD-10-CM | POA: Diagnosis not present

## 2018-02-05 DIAGNOSIS — J069 Acute upper respiratory infection, unspecified: Secondary | ICD-10-CM | POA: Diagnosis not present

## 2018-02-19 DIAGNOSIS — M545 Low back pain: Secondary | ICD-10-CM | POA: Diagnosis not present

## 2018-02-19 DIAGNOSIS — R5381 Other malaise: Secondary | ICD-10-CM | POA: Diagnosis not present

## 2018-02-19 DIAGNOSIS — M722 Plantar fascial fibromatosis: Secondary | ICD-10-CM | POA: Diagnosis not present

## 2018-03-12 DIAGNOSIS — M722 Plantar fascial fibromatosis: Secondary | ICD-10-CM | POA: Diagnosis not present

## 2018-03-12 DIAGNOSIS — R5381 Other malaise: Secondary | ICD-10-CM | POA: Diagnosis not present

## 2018-03-12 DIAGNOSIS — K219 Gastro-esophageal reflux disease without esophagitis: Secondary | ICD-10-CM | POA: Diagnosis not present

## 2018-03-29 DIAGNOSIS — M722 Plantar fascial fibromatosis: Secondary | ICD-10-CM | POA: Diagnosis not present

## 2018-04-05 DIAGNOSIS — M722 Plantar fascial fibromatosis: Secondary | ICD-10-CM | POA: Diagnosis not present

## 2018-04-08 DIAGNOSIS — M722 Plantar fascial fibromatosis: Secondary | ICD-10-CM | POA: Diagnosis not present

## 2018-04-19 DIAGNOSIS — M722 Plantar fascial fibromatosis: Secondary | ICD-10-CM | POA: Diagnosis not present

## 2018-04-23 DIAGNOSIS — M722 Plantar fascial fibromatosis: Secondary | ICD-10-CM | POA: Diagnosis not present

## 2018-04-30 DIAGNOSIS — M722 Plantar fascial fibromatosis: Secondary | ICD-10-CM | POA: Diagnosis not present

## 2018-05-06 DIAGNOSIS — Z23 Encounter for immunization: Secondary | ICD-10-CM | POA: Diagnosis not present

## 2018-05-07 DIAGNOSIS — M722 Plantar fascial fibromatosis: Secondary | ICD-10-CM | POA: Diagnosis not present

## 2018-05-26 DIAGNOSIS — M722 Plantar fascial fibromatosis: Secondary | ICD-10-CM | POA: Diagnosis not present

## 2018-06-01 DIAGNOSIS — M722 Plantar fascial fibromatosis: Secondary | ICD-10-CM | POA: Diagnosis not present

## 2018-06-07 DIAGNOSIS — M722 Plantar fascial fibromatosis: Secondary | ICD-10-CM | POA: Diagnosis not present

## 2018-06-10 DIAGNOSIS — M722 Plantar fascial fibromatosis: Secondary | ICD-10-CM | POA: Diagnosis not present

## 2018-06-11 DIAGNOSIS — M722 Plantar fascial fibromatosis: Secondary | ICD-10-CM | POA: Diagnosis not present

## 2018-06-11 DIAGNOSIS — N390 Urinary tract infection, site not specified: Secondary | ICD-10-CM | POA: Diagnosis not present

## 2018-06-11 DIAGNOSIS — R109 Unspecified abdominal pain: Secondary | ICD-10-CM | POA: Diagnosis not present

## 2018-06-11 DIAGNOSIS — K219 Gastro-esophageal reflux disease without esophagitis: Secondary | ICD-10-CM | POA: Diagnosis not present

## 2018-06-11 DIAGNOSIS — M19049 Primary osteoarthritis, unspecified hand: Secondary | ICD-10-CM | POA: Diagnosis not present

## 2018-06-16 DIAGNOSIS — M722 Plantar fascial fibromatosis: Secondary | ICD-10-CM | POA: Diagnosis not present

## 2018-06-24 DIAGNOSIS — M722 Plantar fascial fibromatosis: Secondary | ICD-10-CM | POA: Diagnosis not present

## 2018-06-30 DIAGNOSIS — M722 Plantar fascial fibromatosis: Secondary | ICD-10-CM | POA: Diagnosis not present

## 2018-07-09 DIAGNOSIS — R5381 Other malaise: Secondary | ICD-10-CM | POA: Diagnosis not present

## 2018-07-09 DIAGNOSIS — K219 Gastro-esophageal reflux disease without esophagitis: Secondary | ICD-10-CM | POA: Diagnosis not present

## 2018-07-15 DIAGNOSIS — M722 Plantar fascial fibromatosis: Secondary | ICD-10-CM | POA: Diagnosis not present

## 2018-07-29 DIAGNOSIS — M722 Plantar fascial fibromatosis: Secondary | ICD-10-CM | POA: Diagnosis not present

## 2018-08-11 DIAGNOSIS — M722 Plantar fascial fibromatosis: Secondary | ICD-10-CM | POA: Diagnosis not present

## 2018-08-19 DIAGNOSIS — M722 Plantar fascial fibromatosis: Secondary | ICD-10-CM | POA: Diagnosis not present

## 2018-08-23 DIAGNOSIS — Z0000 Encounter for general adult medical examination without abnormal findings: Secondary | ICD-10-CM | POA: Diagnosis not present

## 2018-08-26 DIAGNOSIS — Z23 Encounter for immunization: Secondary | ICD-10-CM | POA: Diagnosis not present

## 2018-08-26 DIAGNOSIS — E782 Mixed hyperlipidemia: Secondary | ICD-10-CM | POA: Diagnosis not present

## 2018-08-26 DIAGNOSIS — K219 Gastro-esophageal reflux disease without esophagitis: Secondary | ICD-10-CM | POA: Diagnosis not present

## 2018-08-26 DIAGNOSIS — Z0000 Encounter for general adult medical examination without abnormal findings: Secondary | ICD-10-CM | POA: Diagnosis not present

## 2018-09-03 DIAGNOSIS — M722 Plantar fascial fibromatosis: Secondary | ICD-10-CM | POA: Diagnosis not present

## 2018-09-23 DIAGNOSIS — D225 Melanocytic nevi of trunk: Secondary | ICD-10-CM | POA: Diagnosis not present

## 2018-09-23 DIAGNOSIS — L918 Other hypertrophic disorders of the skin: Secondary | ICD-10-CM | POA: Diagnosis not present

## 2018-09-23 DIAGNOSIS — Z1283 Encounter for screening for malignant neoplasm of skin: Secondary | ICD-10-CM | POA: Diagnosis not present

## 2018-09-23 DIAGNOSIS — L820 Inflamed seborrheic keratosis: Secondary | ICD-10-CM | POA: Diagnosis not present

## 2020-01-19 ENCOUNTER — Other Ambulatory Visit: Payer: Self-pay

## 2020-01-19 ENCOUNTER — Encounter: Payer: Self-pay | Admitting: Neurology

## 2020-01-19 ENCOUNTER — Ambulatory Visit (INDEPENDENT_AMBULATORY_CARE_PROVIDER_SITE_OTHER): Payer: 59 | Admitting: Neurology

## 2020-01-19 VITALS — BP 125/79 | HR 69 | Ht 69.0 in | Wt 184.0 lb

## 2020-01-19 DIAGNOSIS — R0683 Snoring: Secondary | ICD-10-CM

## 2020-01-19 DIAGNOSIS — G4719 Other hypersomnia: Secondary | ICD-10-CM

## 2020-01-19 DIAGNOSIS — E663 Overweight: Secondary | ICD-10-CM

## 2020-01-19 DIAGNOSIS — R519 Headache, unspecified: Secondary | ICD-10-CM

## 2020-01-19 DIAGNOSIS — G478 Other sleep disorders: Secondary | ICD-10-CM

## 2020-01-19 NOTE — Progress Notes (Signed)
Subjective:    Patient ID: Brenda Martinez is a 54 y.o. female.  HPI     Star Age, MD, PhD [facility] [address], [address] [place], [place] [address]  Dear Benjamine Mola,  I saw your patient, Brenda Martinez, upon your kind request, sleep clinic today for initial consultation of her sleep disorder, in particular, concern for underlying obstructive sleep apnea.  The patient is unaccompanied today.  As you know, Brenda Martinez is a 54 year old right-handed woman with an underlying medical history of hyperlipidemia, depression, anxiety, reflux disease, and mildly overweight state, who reports snoring and excessive daytime somnolence.  I reviewed your office note from 12/30/2019.  Her Epworth sleepiness score is 6 out of 24, fatigue severity score is 18 out of 63.  She does not wake up rested.  She has had occasional dull bifrontal headaches in the mornings.  She does not have night to night nocturia.  One of her sisters had a sleep study but no one has been diagnosed with obstructive sleep apnea.  She has started snoring the past year or so.  Her husband has noticed that and it bothers him from time to time.  For her mood, she has been on Wellbutrin and Lexapro, stable doses and she is followed by Dr. Toy Care.  She is currently not working.  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  She is a non-smoker, drinks alcohol occasionally, caffeine in the form of coffee, about 2 cups/day in the mornings.  She denies any telltale symptoms of restless leg syndrome but had severe discomfort in the left foot and leg from flareup of plantar fasciitis in the recent past.  She does not watch TV in the bedroom.  Bedtime is typically around 11 and rise time around 7:30 AM.  She lives with her husband and younger of 2 sons, age 41, she has a 20 year old son as well.  They have 1 dog in the household, he typically sleeps in their bedroom on the floor.    Her Past  Medical History Is Significant For: Past Medical History:  Diagnosis Date  . Contraceptive management   . 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 HYPERLIPIDEMIA   . Other anxiety states     Her Past Surgical History Is Significant For: No past surgical history on file.  Her Family History Is Significant For: Family History  Problem Relation Age of Onset  . Heart disease Father        CABG  . Heart attack Maternal Grandfather   . Heart attack Paternal Grandfather     Her Social History Is Significant For: Social History   Socioeconomic History  . Marital status: Married    Spouse name: Not on file  . Number of children: Not on file  . Years of education: Not on file  . Highest education level: Not on file  Occupational History  . Not on file  Tobacco Use  . Smoking status: Never Smoker  . Smokeless tobacco: Never Used  Substance and Sexual Activity  . Alcohol use: No  . Drug use: No  . Sexual activity: Yes    Birth control/protection: Pill  Other Topics Concern  . Not on file  Social History Narrative  . Not on file   Social Determinants of Health   Financial Resource Strain:   . Difficulty of Paying Living Expenses:   Food Insecurity:   . Worried About  Running Out of Food in the Last Year:   . [place] in the Last Year:   Transportation Needs:   . Lack of Transportation (Medical):   [name] Lack of Transportation (Non-Medical):   Physical Activity:   . Days of Exercise per Week:   . Minutes of Exercise per Session:   Stress:   . Feeling of Stress :   Social Connections:   . Frequency of Communication with Friends and Family:   . Frequency of Social Gatherings with Friends and Family:   . Attends Religious Services:   . Active Member of Clubs or Organizations:   . Attends Archivist Meetings:   [name] Marital Status:     Her Allergies Are:  No Known Allergies:   Her Current Medications Are:  Outpatient Encounter Medications as of 01/19/2020   Medication Sig  . buPROPion (WELLBUTRIN XL) 150 MG 24 hr tablet Take 150 mg by mouth daily.   [name] escitalopram (LEXAPRO) 20 MG tablet Take 20 mg by mouth daily.   [name] levonorgestrel-ethinyl estradiol (NORDETTE) 0.15-30 MG-MCG tablet Take 1 tablet by mouth daily.  . Multiple Vitamin (MULTIVITAMIN) tablet Take 1 tablet by mouth daily.  [name] omeprazole (PRILOSEC) 40 MG capsule Take 40 mg by mouth daily.  . [DISCONTINUED] B Complex-C (SUPER B COMPLEX PO) Take 1 tablet by mouth daily.  . [DISCONTINUED] ranitidine (ZANTAC) 300 MG tablet Take 300 mg by mouth daily.   No facility-administered encounter medications on file as of 01/19/2020.  :  Review of Systems:  Out of a complete 14 point review of systems, all are reviewed and negative with the exception of these symptoms as listed below: Review of Systems  Neurological:       Pt presents today to discuss her sleep. Pt has never had a sleep study but does endorse snoring.  Epworth Sleepiness Scale 0= would never doze 1= slight chance of dozing 2= moderate chance of dozing 3= high chance of dozing  Sitting and reading: 1 Watching TV: 2 Sitting inactive in a public place (ex. Theater or meeting): 0 As a passenger in a car for an hour without a break: 1 Lying down to rest in the afternoon: 2 Sitting and talking to someone: 0 Sitting quietly after lunch (no alcohol): 0 In a car, while stopped in traffic: 0 Total: 6     Objective:  Neurological Exam  Physical Exam Physical Examination:   Vitals:   01/19/20 1058  BP: 125/79  Pulse: 69    General Examination: The patient is a very pleasant 54 y.o. female in no acute distress. She appears well-developed and well-nourished and well groomed.   HEENT: Normocephalic, atraumatic, pupils are equal, round and reactive to light, extraocular tracking is good without limitation to gaze excursion or nystagmus noted. Hearing is grossly intact. Corrective eye glasses. Face is symmetric with normal  facial animation. Speech is clear with no dysarthria noted. There is no hypophonia. There is no lip, neck/head, jaw or voice tremor. Neck is supple with full range of passive and active motion. There are no carotid bruits on auscultation. Oropharynx exam reveals: mild mouth dryness, good dental hygiene and mild airway crowding, due to smaller airway entry and redundant soft palate, tonsils 1+. Mallampati is class II. Tongue protrudes centrally and palate elevates symmetrically. Neck size is 15 inches. She has a Mild overbite.   Chest: Clear to auscultation without wheezing, rhonchi or crackles noted.  Heart: S1+S2+0, regular and normal without murmurs, rubs or  gallops noted.   Abdomen: Soft, non-tender and non-distended with normal bowel sounds appreciated on auscultation.  Extremities: There is no pitting edema in the distal lower extremities bilaterally.   Skin: Warm and dry without trophic changes noted.   Musculoskeletal: exam reveals no obvious joint deformities, tenderness or joint swelling or erythema.   Neurologically:  Mental status: The patient is awake, alert and oriented in all 4 spheres. Her immediate and remote memory, attention, language skills and fund of knowledge are appropriate. There is no evidence of aphasia, agnosia, apraxia or anomia. Speech is clear with normal prosody and enunciation. Thought process is linear. Mood is normal and affect is normal.  Cranial nerves II - XII are as described above under HEENT exam.  Motor exam: Normal bulk, strength and tone is noted. There is no tremor, Romberg is negative. Fine motor skills and coordination: grossly intact.  Cerebellar testing: No dysmetria or intention tremor. There is no truncal or gait ataxia.  Sensory exam: intact to light touch in the upper and lower extremities.  Gait, station and balance: She stands easily. No veering to one side is noted. No leaning to one side is noted. Posture is age-appropriate and stance is  narrow based. Gait shows normal stride length and normal pace. No problems turning are noted. Tandem walk is unremarkable.                Assessment and Plan:  In summary, BRISSIA QUILES is a very pleasant 53 y.o.-year old female with an underlying medical history of hyperlipidemia, depression, anxiety, reflux disease, and mildly overweight state, whose history and physical exam are concerning for obstructive sleep apnea (OSA). I had a long chat with the patient about my findings and the diagnosis of OSA, its prognosis and treatment options. We talked about medical treatments, surgical interventions and non-pharmacological approaches. I explained in particular the risks and ramifications of untreated moderate to severe OSA, especially with respect to developing cardiovascular disease down the Road, including congestive heart failure, difficult to treat hypertension, cardiac arrhythmias, or stroke. Even type 2 diabetes has, in part, been linked to untreated OSA. Symptoms of untreated OSA include daytime sleepiness, memory problems, mood irritability and mood disorder such as depression and anxiety, lack of energy, as well as recurrent headaches, especially morning headaches. We talked about trying to maintain a healthy lifestyle in general, as well as the importance of weight control. We also talked about the importance of good sleep hygiene. I recommended the following at this time: sleep study.  I explained the sleep test procedure to the patient and also outlined possible surgical and non-surgical treatment options of OSA, including the use of a custom-made dental device (which would require a referral to a specialist dentist or oral surgeon), upper airway surgical options, such as traditional UPPP or a novel less invasive surgical option in the form of Inspire hypoglossal nerve stimulation (which would involve a referral to an ENT surgeon). I also explained the CPAP treatment option to the patient, who  indicated that she would be willing to try CPAP if the need arises. I explained the importance of being compliant with PAP treatment, not only for insurance purposes but primarily to improve Her symptoms, and for the patient's long term health benefit, including to reduce Her cardiovascular risks. I answered all her questions today and the patient was in agreement. I plan to see her back after the sleep study is completed and encouraged her to call with any interim questions, concerns,  problems or updates.   Thank you very much for allowing me to participate in the care of this nice patient. If I can be of any further assistance to you please do not hesitate to call me at [phone].  Sincerely,   Star Age, MD, PhD

## 2020-01-19 NOTE — Patient Instructions (Signed)

## 2020-02-16 ENCOUNTER — Telehealth: Payer: Self-pay

## 2020-02-16 NOTE — Telephone Encounter (Signed)
2nd lvm for hst 

## 2020-02-21 ENCOUNTER — Telehealth: Payer: Self-pay

## 2020-02-21 NOTE — Telephone Encounter (Signed)
LVM for pt to call me back to schedule sleep study  

## 2020-03-07 ENCOUNTER — Telehealth: Payer: Self-pay

## 2020-03-07 NOTE — Telephone Encounter (Signed)
Have attempted multiple times to schedule HST with patient. She has a  Lot of questions and concerns, mainly on the cost of HST and of CPAP with supplies. Tried answering those questions the best I could. At last phone call on 5/18, pt stated she needed to think about it more and consider the financials as well. Pt was asked to call me back when ready to schedule

## 2020-11-23 ENCOUNTER — Other Ambulatory Visit: Payer: Self-pay | Admitting: Family Medicine

## 2020-11-23 DIAGNOSIS — Z1231 Encounter for screening mammogram for malignant neoplasm of breast: Secondary | ICD-10-CM

## 2024-03-11 ENCOUNTER — Other Ambulatory Visit: Payer: Self-pay

## 2024-03-11 ENCOUNTER — Other Ambulatory Visit (HOSPITAL_COMMUNITY): Payer: Self-pay

## 2024-03-15 DIAGNOSIS — F332 Major depressive disorder, recurrent severe without psychotic features: Secondary | ICD-10-CM | POA: Insufficient documentation

## 2024-03-21 ENCOUNTER — Other Ambulatory Visit: Payer: Self-pay

## 2024-03-21 ENCOUNTER — Encounter (HOSPITAL_COMMUNITY): Payer: Self-pay | Admitting: Nurse Practitioner

## 2024-03-21 ENCOUNTER — Encounter (HOSPITAL_COMMUNITY): Payer: Self-pay

## 2024-03-21 ENCOUNTER — Inpatient Hospital Stay (HOSPITAL_COMMUNITY)
Admission: AD | Admit: 2024-03-21 | Discharge: 2024-03-26 | DRG: 885 | Disposition: A | Discharge status: Home / Self Care | Source: Intra-hospital | Attending: Psychiatry | Admitting: Psychiatry

## 2024-03-21 ENCOUNTER — Emergency Department (EMERGENCY_DEPARTMENT_HOSPITAL)
Admission: EM | Admit: 2024-03-21 | Discharge: 2024-03-21 | Disposition: A | Discharge status: Other Acute Inpatient Hospital | Source: Home / Self Care | Attending: Emergency Medicine | Admitting: Emergency Medicine

## 2024-03-21 DIAGNOSIS — R45851 Suicidal ideations: Secondary | ICD-10-CM | POA: Insufficient documentation

## 2024-03-21 DIAGNOSIS — Z8249 Family history of ischemic heart disease and other diseases of the circulatory system: Secondary | ICD-10-CM

## 2024-03-21 DIAGNOSIS — G4700 Insomnia, unspecified: Secondary | ICD-10-CM | POA: Diagnosis present

## 2024-03-21 DIAGNOSIS — I10 Essential (primary) hypertension: Secondary | ICD-10-CM | POA: Diagnosis present

## 2024-03-21 DIAGNOSIS — F411 Generalized anxiety disorder: Principal | ICD-10-CM | POA: Diagnosis present

## 2024-03-21 DIAGNOSIS — Z823 Family history of stroke: Secondary | ICD-10-CM

## 2024-03-21 DIAGNOSIS — F332 Major depressive disorder, recurrent severe without psychotic features: Secondary | ICD-10-CM | POA: Diagnosis present

## 2024-03-21 DIAGNOSIS — Z818 Family history of other mental and behavioral disorders: Secondary | ICD-10-CM | POA: Diagnosis not present

## 2024-03-21 DIAGNOSIS — F32A Depression, unspecified: Secondary | ICD-10-CM

## 2024-03-21 DIAGNOSIS — Z79899 Other long term (current) drug therapy: Secondary | ICD-10-CM

## 2024-03-21 DIAGNOSIS — E785 Hyperlipidemia, unspecified: Secondary | ICD-10-CM | POA: Diagnosis present

## 2024-03-21 DIAGNOSIS — Z560 Unemployment, unspecified: Secondary | ICD-10-CM | POA: Diagnosis not present

## 2024-03-21 DIAGNOSIS — F329 Major depressive disorder, single episode, unspecified: Secondary | ICD-10-CM | POA: Insufficient documentation

## 2024-03-21 LAB — LIPID PANEL
Cholesterol: 199 mg/dL (ref 0–200)
HDL: 73 mg/dL (ref >40)
LDL Cholesterol: 116 mg/dL — ABNORMAL HIGH (ref 0–99)
Total CHOL/HDL Ratio: 2.7 ratio
Triglycerides: 50 mg/dL (ref <150)
VLDL: 10 mg/dL (ref 0–40)

## 2024-03-21 LAB — URINALYSIS, ROUTINE W REFLEX MICROSCOPIC
Bilirubin Urine: NEGATIVE
Glucose, UA: NEGATIVE mg/dL
Hgb urine dipstick: NEGATIVE
Ketones, ur: 20 mg/dL — AB
Leukocytes,Ua: NEGATIVE
Nitrite: NEGATIVE
Protein, ur: NEGATIVE mg/dL
Specific Gravity, Urine: 1.021 (ref 1.005–1.030)
pH: 5 (ref 5.0–8.0)

## 2024-03-21 LAB — COMPREHENSIVE METABOLIC PANEL WITH GFR
ALT: 7 U/L (ref 0–44)
AST: 18 U/L (ref 15–41)
Albumin: 4.3 g/dL (ref 3.5–5.0)
Alkaline Phosphatase: 59 U/L (ref 38–126)
Anion gap: 8 (ref 5–15)
BUN: 14 mg/dL (ref 6–20)
CO2: 25 mmol/L (ref 22–32)
Calcium: 9.5 mg/dL (ref 8.9–10.3)
Chloride: 105 mmol/L (ref 98–111)
Creatinine, Ser: 0.65 mg/dL (ref 0.44–1.00)
GFR, Estimated: >60 mL/min (ref >60)
Glucose, Bld: 96 mg/dL (ref 70–99)
Potassium: 4.2 mmol/L (ref 3.5–5.1)
Sodium: 138 mmol/L (ref 135–145)
Total Bilirubin: 0.6 mg/dL (ref 0.0–1.2)
Total Protein: 7 g/dL (ref 6.5–8.1)

## 2024-03-21 LAB — RAPID URINE DRUG SCREEN, HOSP PERFORMED
Amphetamines: NOT DETECTED
Barbiturates: NOT DETECTED
Benzodiazepines: POSITIVE — AB
Cocaine: NOT DETECTED
Opiates: NOT DETECTED
Tetrahydrocannabinol: NOT DETECTED

## 2024-03-21 LAB — CBC WITH DIFFERENTIAL/PLATELET
Abs Immature Granulocytes: 0.01 10*3/uL (ref 0.00–0.07)
Basophils Absolute: 0 10*3/uL (ref 0.0–0.1)
Basophils Relative: 0 %
Eosinophils Absolute: 0 10*3/uL (ref 0.0–0.5)
Eosinophils Relative: 0 %
HCT: 43.5 % (ref 36.0–46.0)
Hemoglobin: 14.6 g/dL (ref 12.0–15.0)
Immature Granulocytes: 0 %
Lymphocytes Relative: 24 %
Lymphs Abs: 1.5 10*3/uL (ref 0.7–4.0)
MCH: 29.8 pg (ref 26.0–34.0)
MCHC: 33.6 g/dL (ref 30.0–36.0)
MCV: 88.8 fL (ref 80.0–100.0)
Monocytes Absolute: 0.4 10*3/uL (ref 0.1–1.0)
Monocytes Relative: 6 %
Neutro Abs: 4.2 10*3/uL (ref 1.7–7.7)
Neutrophils Relative %: 70 %
Platelets: 261 10*3/uL (ref 150–400)
RBC: 4.9 MIL/uL (ref 3.87–5.11)
RDW: 11.8 % (ref 11.5–15.5)
WBC: 6.1 10*3/uL (ref 4.0–10.5)
nRBC: 0 % (ref 0.0–0.2)

## 2024-03-21 LAB — TSH: TSH: 0.394 u[IU]/mL (ref 0.350–4.500)

## 2024-03-21 LAB — LIPASE, BLOOD: Lipase: 26 U/L (ref 11–51)

## 2024-03-21 MED ORDER — TRAZODONE HCL 50 MG PO TABS
50.0000 mg | ORAL_TABLET | Freq: Every evening | ORAL | Status: DC | PRN
  Administered 2024-03-21: 50 mg via ORAL
  Filled 2024-03-21: qty 1

## 2024-03-21 MED ORDER — OLANZAPINE 5 MG PO TBDP: 5.0000 mg | ORAL_TABLET | Freq: Three times a day (TID) | ORAL | Status: DC | PRN

## 2024-03-21 MED ORDER — ALUM & MAG HYDROXIDE-SIMETH 200-200-20 MG/5ML PO SUSP: 30.0000 mL | ORAL | Status: DC | PRN

## 2024-03-21 MED ORDER — MAGNESIUM HYDROXIDE 400 MG/5ML PO SUSP: 30.0000 mL | Freq: Every day | ORAL | Status: DC | PRN

## 2024-03-21 MED ORDER — LORAZEPAM 1 MG PO TABS
1.0000 mg | ORAL_TABLET | Freq: Four times a day (QID) | ORAL | Status: DC | PRN
  Administered 2024-03-21 – 2024-03-25 (×6): 1 mg via ORAL
  Filled 2024-03-21 (×6): qty 1

## 2024-03-21 MED ORDER — OLANZAPINE 10 MG IM SOLR: 5.0000 mg | Freq: Three times a day (TID) | INTRAMUSCULAR | Status: DC | PRN

## 2024-03-21 MED ORDER — LORAZEPAM 1 MG PO TABS
1.0000 mg | ORAL_TABLET | ORAL | Status: AC
  Administered 2024-03-21: 1 mg via ORAL
  Filled 2024-03-21: qty 1

## 2024-03-21 MED ORDER — LORAZEPAM 1 MG PO TABS: 1.0000 mg | ORAL_TABLET | Freq: Four times a day (QID) | ORAL | Status: DC | PRN

## 2024-03-21 MED ORDER — ACETAMINOPHEN 325 MG PO TABS: 650.0000 mg | ORAL_TABLET | Freq: Four times a day (QID) | ORAL | Status: DC | PRN

## 2024-03-21 MED ORDER — HYDROXYZINE HCL 25 MG PO TABS
25.0000 mg | ORAL_TABLET | Freq: Three times a day (TID) | ORAL | Status: DC | PRN
  Administered 2024-03-21 – 2024-03-23 (×4): 25 mg via ORAL
  Filled 2024-03-21 (×4): qty 1

## 2024-03-21 MED ORDER — ENSURE PLUS HIGH PROTEIN PO LIQD
237.0000 mL | Freq: Two times a day (BID) | ORAL | Status: DC
  Administered 2024-03-22 – 2024-03-26 (×6): 237 mL via ORAL
  Filled 2024-03-21 (×11): qty 237

## 2024-03-21 MED ORDER — OLANZAPINE 10 MG IM SOLR: 10.0000 mg | Freq: Three times a day (TID) | INTRAMUSCULAR | Status: DC | PRN

## 2024-03-21 NOTE — ED Notes (Signed)
 Got patient into a gown on the monitor did EKG shown to Dr Moses Arenas walked patient to the bathroom for sample patient has family at bedside

## 2024-03-21 NOTE — ED Notes (Signed)
 [facility] Admit

## 2024-03-21 NOTE — Progress Notes (Signed)
 Admission Note: Patient is a 58 years old female admitted to the unit voluntarily from [facility] for symptoms of depression, anxiety and passive SI.  Stated her current medication regimen is not effective.  Patient is alert and oriented x 4.  Patient presents with a flat affect and depressed mood.  Patient tearful throughout admission process.  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  Admission plan of care reviewed, consent for treatment signed.  Skin and personal belongings completed.  Skin is dry and intact.  Items deemed contraband placed in the locker.  Patient oriented to the unit, staff and room.  Routine safety checks initiated.  Verbalizes understanding of unit rules/protocols.  Patient is safe on the unit.

## 2024-03-21 NOTE — Consult Note (Signed)
 [facility] Psychiatric Consult Initial  Patient Name: .Brenda Martinez  MRN: [id number]  DOB: 1966-03-19  Consult Order details:  Orders (From admission, onward)     Start     Ordered   03/21/24 1045  CONSULT TO CALL ACT TEAM       Ordering Provider: Craige Dixon, MD  Provider:  (Not yet assigned)  Question:  Reason for Consult?  Answer:  worsening depression, new passive SI and "not want to go on like this". on many prior psych meds with her psychiatist but acute worsening. tearful   03/21/24 1045             Mode of Visit: In person    Psychiatry Consult Evaluation  Service Date: March 21, 2024 LOS:  LOS: 0 days  Chief Complaint worsening anxiety, passive SI  Primary Psychiatric Diagnoses  GAD 2.  MDD 3.    Assessment  Brenda Martinez is a 58 y.o. female admitted: Presented to the ED for 03/21/2024  9:48 AM for worsening anxiety, depression, and passive SI. She carries the psychiatric diagnoses of MDD, GAD and has a past medical history of  nothing pertinent.   Her current presentation  is most consistent with GAD, MDD. She meets criteria for inpatient based on passive SI, seeking treatment.  Please see plan below for detailed recommendations.   Diagnoses:  Active Hospital problems: Principal Problem:   GAD (generalized anxiety disorder) Active Problems:   MDD (major depressive disorder), recurrent severe, without psychosis (HCC)    Plan   ## Psychiatric Medication Recommendations:  - Continue Lorazepan 1 mg q6 PRN for anxiety - Will hold of on scheduled medication recommendation for inpatient treatment team to determine, as she will be transferred to [facility] today  Patient reports these medication trials:  - Successfully on Lexparo and Wellbutrin for 15 years. 1 year ago felt like meds stopped working with worsening anxiety and depression. Follows up at Dr. Derek Flavors outpatient with these different unsuccessful medications over the past 10 months: Trazodone,  Remeron, Cymbalta, Auvelity, Buspar  ## Medical Decision Making Capacity: Not specifically addressed in this encounter  ## Further Work-up:  EKG, blood work pending  ## Disposition:-- We recommend inpatient psychiatric hospitalization when medically cleared. Patient is under voluntary admission status at this time; please IVC if attempts to leave hospital.  ## Behavioral / Environmental: - No specific recommendations at this time.     ## Safety and Observation Level:  - Based on my clinical evaluation, I estimate the patient to be at low risk of self harm in the current setting. - At this time, we recommend  routine. This decision is based on my review of the chart including patient's history and current presentation, interview of the patient, mental status examination, and consideration of suicide risk including evaluating suicidal ideation, plan, intent, suicidal or self-harm behaviors, risk factors, and protective factors. This judgment is based on our ability to directly address suicide risk, implement suicide prevention strategies, and develop a safety plan while the patient is in the clinical setting. Please contact our team if there is a concern that risk level has changed.  CSSR Risk Category:C-SSRS RISK CATEGORY: No Risk  Suicide Risk Assessment: Patient has following modifiable risk factors for suicide: active suicidal ideation and under treated depression , which we are addressing by inpatient admission. Patient has following non-modifiable or demographic risk factors for suicide: female Patient has the following protective factors against suicide: Access to outpatient mental health care, Supportive  family, Supportive friends, Cultural, spiritual, or religious beliefs that discourage suicide, and Minor children in the home  Thank you for this consult request. Recommendations have been communicated to the primary team.  We will recommend inpatient treatment at this time.   Roise Cleaver, NP       History of Present Illness  Relevant Aspects of Hospital ED Course:  Admitted on 03/21/2024 for worsening depression and anxiety. Pt has been working closely with outpatient provider for over 10 months with little success. She feels at her wits end, does not think she can live longer like this. Pt having passive suicidal thoughts. She is seeking mental health treatment and medication recommendations.   Patient Report:  Patient seen at [facility] ED for face-to-face psychiatric evaluation.  Patient's husband is present, she requested he remain for assessment.  Patient stated she has been diagnosed with depression and anxiety and has followed up at Dr.Kaur as her outpatient psychiatrist for a while.  She was successfully treated on Wellbutrin and Lexapro for around 15 years.  Around 10 months ago she felt her anxiety and depression was worsening and they decided to try different medications.  Her Lexapro was discontinued and replaced with trazodone in which patient felt very sedated and did not like this medication.  They then discontinued her Wellbutrin and started her on mirtazapine.  Patient felt like the mirtazapine worked for around 2 months but then became unsuccessful again.  She then tried Cymbalta in which that was unsuccessful.  She retried Lexapro but only for a week or 2.  They also tried BuSpar as well as lorazepam.  Patient does feel like her lorazepam successfully helps her anxiety in the moment but is not an overall fix.  She states it is hard to wait the 6 to 8 weeks to see if the medication works or not because it is hard to live the way she is feeling.  She reports most recently getting a sample of Auvelity, however took this for around 4 to 5 days and felt it was unsuccessful.  She endorses decreased appetite, has lost weight over the past few months.  Endorses decreased motivation, anhedonia, disturbed sleep, overall depressed mood, and most recently passive suicidal  ideations.  She has no previous suicide attempts.  She states she wants to live, but does not want to live if she continues to feel this way.  She reports extreme anxiety, feeling overly anxious and panicked every day.  She has an adult special needs son that she needs to care for.  She denies HI.  Denies AVH.  She has no use inpatient psychiatric hospitalization history.  Denies illicit substance use or alcohol use.  Patient is willing to get inpatient psychiatric treatment at this time.  Will recommend inpatient psychiatric treatment for further stabilization and medication management.  Patient does not currently have a therapist which would be beneficial for the patient.  Patient is agreeable to treatment plan and transfer.   Review of Systems  Psychiatric/Behavioral:  Positive for depression and suicidal ideas. The patient is nervous/anxious.   All other systems reviewed and are negative.    Psychiatric and Social History  Psychiatric History:  Information collected from patient, chart  Prev Dx/Sx: MDD, GAD Current Psych Provider: Dr. 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 rate 14, height 5\' 9"  (1.753 m), weight 70.3 kg, last menstrual  period 01/27/2012, SpO2 96%. Body mass index is 22.89 kg/m.  Physical Exam Vitals and nursing note reviewed.  Neurological:     Mental Status: She is alert and oriented to person, place, and time.     Mental Status Exam: General Appearance: Well Groomed  Orientation:  Full (Time, Place, and Person)  Memory:  Immediate;   Good Recent;   Good  Concentration:  Concentration: Good  Recall:  Good  Attention  Good  Eye Contact:  Good  Speech:  Clear and Coherent  Language:  Good  Volume:  Normal  Mood: depressed, anxious  Affect:  Congruent and Tearful  Thought Process:  Goal Directed  Thought Content:  Logical  Suicidal Thoughts:  Yes.  without intent/plan  Homicidal Thoughts:  No  Judgement:  Fair  Insight:  Fair  Psychomotor Activity:  Normal  Akathisia:  No  Fund of Knowledge:  Good      Assets:  Communication Skills Desire for Improvement Housing Leisure Time Physical Health Resilience Social Support  Cognition:  WNL  ADL's:  Intact  AIMS (if indicated):        Other History   These have been pulled in through the EMR, reviewed, and updated if appropriate.  Family History:  The patient's family history includes Heart attack in her maternal grandfather and paternal grandfather; Heart disease in her father.  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 No pertinent surgical history.   Medications:   Current Facility-Administered Medications:    LORazepam (ATIVAN) tablet 1 mg, 1 mg, Oral, NOW, Ford Ide, Daton Szilagyi, NP   LORazepam (ATIVAN) tablet 1 mg, 1 mg, Oral, Q6H PRN, Roise Cleaver, NP  Current Outpatient Medications:    buPROPion (WELLBUTRIN XL) 150 MG 24 hr tablet, Take 150 mg by mouth daily. , Disp: , Rfl:    escitalopram (LEXAPRO) 20 MG tablet, Take 20 mg by mouth daily. , Disp: , Rfl:     levonorgestrel -ethinyl estradiol (NORDETTE) 0.15-30 MG-MCG tablet, Take 1 tablet by mouth daily., Disp: , Rfl:    Multiple Vitamin (MULTIVITAMIN) tablet, Take 1 tablet by mouth daily., Disp: , Rfl:    omeprazole (PRILOSEC) 40 MG capsule, Take 40 mg by mouth daily., Disp: , Rfl:   Allergies: No Known Allergies  Roise Cleaver, NP

## 2024-03-21 NOTE — ED Triage Notes (Signed)
 Pt states she has had depression for years, started feeling anxious in the fall, saw psychaitrist. Pt was taken off wellbutrin and lexapro. Pt was switched to wellbutrin and trazadone, mirtazapine, cymbaltad, lexapro and none of the medications have worked. Pt states she feels like she cannot function. Pt states she has a "hot" feeling in her abdomen d/t nerves. Pt seen by PCP for same. Pt has also had ketamine. Pt denies suicidal ideation, but states, "I don't know how to live like this." Pt denies homicidal ideations. Pt denies visual and/or auditory hallucinations. Pt tearful in triage.

## 2024-03-21 NOTE — Tx Team (Addendum)
 Initial Treatment Plan 03/21/2024 6:41 PM Brenda Martinez WUJ:[id number]    PATIENT STRESSORS: Health problems   Occupational concerns     PATIENT STRENGTHS: Ability for insight  Capable of independent living  Communication skills  General fund of knowledge  Supportive family/friends    PATIENT IDENTIFIED PROBLEMS: "Only work on things I can change"  Depression  Anxiety  Medication adjustment  Ineffective coping skills  Passive SI           DISCHARGE CRITERIA:  Ability to meet basic life and health needs Adequate post-discharge living arrangements Motivation to continue treatment in a less acute level of care  PRELIMINARY DISCHARGE PLAN: Attend aftercare/continuing care group Outpatient therapy Return to previous living arrangement  PATIENT/FAMILY INVOLVEMENT: This treatment plan has been presented to and reviewed with the patient, Brenda Martinez, and/or family member.  The patient and family have been given the opportunity to ask questions and make suggestions.  Curvin Downing, RN 03/21/2024, 6:41 PM

## 2024-03-21 NOTE — ED Notes (Signed)
 Safe transport here to pick pt up; pt escorted to transport by Alisa App, Charity fundraiser

## 2024-03-21 NOTE — ED Provider Triage Note (Signed)
 Emergency Medicine Provider Triage Evaluation Note  DAE ANTONUCCI , a 58 y.o. female  was evaluated in triage.  Pt complains of worsening depression, tearfulness, and now some thoughts of "not wanting to go on like this anymore".  Review of Systems  Positive: Depression, burning lower abdominal discomfort and nausea with her sadness and now what sounds to be passive suicidal thoughts Negative: Hallucinations, homicidal ideation  Physical Exam  BP 123/78 (BP Location: Right Arm)   Pulse 84   Temp 98 F (36.7 C) (Oral)   Resp 14   Ht 5\' 9"  (1.753 m)   Wt 70.3 kg   LMP 01/27/2012   SpO2 96%   BMI 22.89 kg/m  Gen:   Awake, crying and tearfulness Resp:  Normal effort  MSK:   Moves extremities without difficulty  Other:  Tearful, very sad appearing.  Denies homicidal ideation or hallucinations  Medical Decision Making  Medically screening exam initiated at 10:33 AM.  Appropriate orders placed.  AMELYA MABRY was informed that the remainder of the evaluation will be completed by another provider, this initial triage assessment does not replace that evaluation, and the importance of remaining in the ED until their evaluation is complete.  SHADAE REINO is a 58 y.o. female with a long history of anxiety and depression who was well-managed for about 15 years before making some changes over the last year.  She reports that she gets a nausea and abdominal cramping that she has had for over a year on and off when she feels anxious and nervous and depressed but she reports no new problem with this.  She says that she has never had suicidal thoughts but when I asked her if she wanted hurt anybody else or herself she said "well definitely not anybody else" and then was quite about herself.  She says she does not have a plan to hurt herself but says she does have thoughts of not want to go on like this.  I suspect this is passive suicidal ideation.  She denies any homicidal thoughts or  hallucinations.  She is never felt this bad before.  She reports that her psychiatrist Dr. Gavin Kast has been trying many different things including in clinic had intranasal ketamine that has not seemed to help.  Patient came in because symptoms are acutely worsening and she is tearful.  She reports her son is graduating from high school this week and she is worried about that as well.  Will get screening labs for medical clearance and will consult TTS for the start.  Anticipate follow-up on the recommendations further evaluation by a provider when she is seen.     Chantelle Verdi, Marine Sia, MD 03/21/24 [phone]

## 2024-03-21 NOTE — ED Provider Notes (Signed)
 [place] [facility]ENC[facility] DEPARTMENT AT [facility] Provider Note   CSN: [id number] Arrival date & time: 03/21/24  0920     History  Chief Complaint  Patient presents with   Depression    Brenda Martinez is a 58 y.o. female.  The history is provided by the patient, the spouse and medical records. No language interpreter was used.       Home Medications Prior to Admission medications   Medication Sig Start Date End Date Taking? Authorizing Provider  buPROPion (WELLBUTRIN XL) 150 MG 24 hr tablet Take 150 mg by mouth daily.  08/01/13   [provider]  escitalopram (LEXAPRO) 20 MG tablet Take 20 mg by mouth daily.  09/18/13   [provider]  levonorgestrel -ethinyl estradiol (NORDETTE) 0.15-30 MG-MCG tablet Take 1 tablet by mouth daily.    [provider]  Multiple Vitamin (MULTIVITAMIN) tablet Take 1 tablet by mouth daily.    [provider]  omeprazole (PRILOSEC) 40 MG capsule Take 40 mg by mouth daily.    [provider]      Allergies    Patient has no known allergies.    Review of Systems   Review of Systems  Constitutional:  Negative for chills, fatigue and fever.  HENT:  Negative for congestion.   Respiratory:  Negative for cough, chest tightness and wheezing.   Cardiovascular:  Negative for chest pain.  Gastrointestinal:  Positive for nausea. Negative for abdominal pain, constipation and vomiting.  Genitourinary:  Negative for dysuria and frequency.  Musculoskeletal:  Negative for back pain and neck pain.  Skin:  Negative for rash and wound.  Neurological:  Negative for weakness, light-headedness, numbness and headaches.  Psychiatric/Behavioral:  Negative for agitation.   All other systems reviewed and are negative.   Physical Exam Updated Vital Signs BP 123/78 (BP Location: Right Arm)   Pulse 84   Temp 98 F (36.7 C) (Oral)   Resp 14   Ht 5\' 9"  (1.753 m)   Wt 70.3 kg   LMP 01/27/2012   SpO2 96%   BMI  22.89 kg/m  Physical Exam Vitals and nursing note reviewed.  Constitutional:      General: She is not in acute distress.    Appearance: She is well-developed. She is not ill-appearing, toxic-appearing or diaphoretic.  HENT:     Head: Normocephalic and atraumatic.     Right Ear: External ear normal.     Left Ear: External ear normal.     Nose: Nose normal.     Mouth/Throat:     Mouth: Mucous membranes are moist.     Pharynx: No oropharyngeal exudate.  Eyes:     Conjunctiva/sclera: Conjunctivae normal.     Pupils: Pupils are equal, round, and reactive to light.  Cardiovascular:     Rate and Rhythm: Normal rate.  Pulmonary:     Effort: No respiratory distress.     Breath sounds: No stridor. No wheezing, rhonchi or rales.  Chest:     Chest wall: No tenderness.  Abdominal:     General: There is no distension.     Tenderness: There is no abdominal tenderness. There is no guarding or rebound.  Musculoskeletal:        General: No tenderness.     Cervical back: Normal range of motion and neck supple. No tenderness.  Skin:    General: Skin is warm.     Findings: No rash.  Neurological:     Mental Status: She is  alert and oriented to person, place, and time.     Motor: No abnormal muscle tone.     Coordination: Coordination normal.     Deep Tendon Reflexes: Reflexes are normal and symmetric.  Psychiatric:        Mood and Affect: Mood is depressed.        Behavior: Behavior is not agitated.        Thought Content: Thought content includes suicidal ideation. Thought content does not include homicidal ideation. Thought content does not include homicidal or suicidal plan.     ED Results / Procedures / Treatments   Labs (all labs ordered are listed, but only abnormal results are displayed) Labs Reviewed  URINALYSIS, ROUTINE W REFLEX MICROSCOPIC - Abnormal; Notable for the following components:      Result Value   APPearance HAZY (*)    Ketones, ur 20 (*)    All other components  within normal limits  RAPID URINE DRUG SCREEN, HOSP PERFORMED - Abnormal; Notable for the following components:   Benzodiazepines POSITIVE (*)    All other components within normal limits  LIPID PANEL - Abnormal; Notable for the following components:   LDL Cholesterol 116 (*)    All other components within normal limits  LIPASE, BLOOD  COMPREHENSIVE METABOLIC PANEL WITH GFR  TSH  CBC WITH DIFFERENTIAL/PLATELET    EKG EKG Interpretation Date/Time:  Monday March 21 2024 12:37:41 EDT Ventricular Rate:  69 PR Interval:  109 QRS Duration:  95 QT Interval:  389 QTC Calculation: 417 R Axis:   44  Text Interpretation: Sinus rhythm Short PR interval when compared to prior, similar appearance No STEMI Confirmed by Wynell Heath ([id number]) on 03/21/2024 12:48:09 PM  Radiology No results found.  Procedures Procedures    Medications Ordered in ED Medications  LORazepam (ATIVAN) tablet 1 mg (has no administration in time range)  LORazepam (ATIVAN) tablet 1 mg (1 mg Oral Given 03/21/24 1249)    ED Course/ Medical Decision Making/ A&P                                 Medical Decision Making Amount and/or Complexity of Data Reviewed Labs: ordered.   From my MSE note earlier:  Brenda Martinez is a 58 y.o. female with a long history of anxiety and depression who was well-managed for about 15 years before making some changes over the last year.  She reports that she gets a nausea and abdominal cramping that she has had for over a year on and off when she feels anxious and nervous and depressed but she reports no new problem with this.  She says that she has never had suicidal thoughts but when I asked her if she wanted hurt anybody else or herself she said "well definitely not anybody else" and then was quite about herself.  She says she does not have a plan to hurt herself but says she does have thoughts of not want to go on like this.  I suspect this is passive suicidal ideation.  She denies  any homicidal thoughts or hallucinations.  She is never felt this bad before.  She reports that her psychiatrist Dr. Gavin Kast has been trying many different things including in clinic had intranasal ketamine that has not seemed to help.   Patient came in because symptoms are acutely worsening and she is tearful.  She reports her son is graduating from high school this week  and she is worried about that as well.   Will get screening labs for medical clearance and will consult TTS for the start.  Anticipate follow-up on the recommendations further evaluation by a provider when she is seen.     12:05 PM Psychiatry was kind enough to see her while she was still in the waiting room and agree that she needs mental health help.  They feel she needs admission and will start that process.  Once she is medically cleared after her screening workup and labs, anticipate admission for psychiatric management.  1:33 PM Labs overall reassuring.  CBC and CMP reassuring.  Lipase not elevated.  TSH normal.  Lipid panel only shows slight elevation in LDL.  Her urinalysis and UDS are collected but have not yet completed however I feel she is medically clear to pursue the admission for behavioral management as recommended.  3:02 PM Urinalysis returned and does not show evidence of clear urinary tract infection.    Patient is found to be medically clear for psychiatric evaluation management at admission.        Final Clinical Impression(s) / ED Diagnoses Final diagnoses:  Depression, unspecified depression type    Clinical Impression: 1. Depression, unspecified depression type     Disposition: Admit to psychiatry for further management of her worsening depression  This note was prepared with assistance of Dragon voice recognition software. Occasional wrong-word or sound-a-like substitutions may have occurred due to the inherent limitations of voice recognition software.     Dana Dorner, Marine Sia,  MD 03/21/24 [phone]

## 2024-03-21 NOTE — ED Notes (Signed)
Report given to Elizabeth, RN at [facility]. 

## 2024-03-21 NOTE — Progress Notes (Signed)
 Pt has been accepted to [facility] on 03/21/2024 Bed assignment: 402-1  Pt meets inpatient criteria per: Roise Cleaver NP  Attending Physician will be: Dr. Zouev MD  Report can be called to: unit: Adult unit: [phone]  Pt can arrive after labs, vol, UDS, EKG.   Care Team Notified: [facility] Danika Eusebio High RN, Roise Cleaver NP  [place] Ezrah Panning LCSW-A   03/21/2024 12:00 PM

## 2024-03-21 NOTE — ED Notes (Signed)
 Patient is back in the bed on the monitor with family at bedside and call bell in reach

## 2024-03-22 MED ORDER — TRAZODONE HCL 100 MG PO TABS
100.0000 mg | ORAL_TABLET | Freq: Every evening | ORAL | Status: DC | PRN
  Administered 2024-03-22 – 2024-03-25 (×4): 100 mg via ORAL
  Filled 2024-03-22 (×4): qty 1

## 2024-03-22 MED ORDER — VENLAFAXINE HCL ER 75 MG PO CP24
75.0000 mg | ORAL_CAPSULE | Freq: Every day | ORAL | Status: DC
  Administered 2024-03-23 – 2024-03-24 (×2): 75 mg via ORAL
  Filled 2024-03-22 (×2): qty 1

## 2024-03-22 NOTE — H&P (Signed)
 Psychiatric Admission Assessment Adult  Patient Identification: Brenda Martinez MRN:  [id number] Date of Evaluation:  03/22/2024 Chief Complaint:  GAD (generalized anxiety disorder) [F41.1] Principal Diagnosis: GAD (generalized anxiety disorder) Diagnosis:  Principal Problem:   GAD (generalized anxiety disorder)  CC: Ms Brenda Martinez is a 58 year old female with past psychiatric history significant for major depressive disorder and generalized anxiety disorder  admitted for acute management of her depression, anxiety, and passive suicidal ideation.    Mode of transport to Hospital: ride to [facility] ED Current Outpatient (Home) Medication List: ativan 0.5 mg TID, buspirone 10 BID, duloxetine 30 mg daily, mirtazapine 45 mg, and esketamine nasal spray PRN medication prior to evaluation: 1x 1 mg of ativan and 1x 25 mg hydroxyzine.  Collateral Information: tbd  HPI:  Ms Brenda Martinez is a 58 year old female with past psychiatric history significant for major depressive disorder and generalized anxiety disorder presenting for 6-8 weeks of worsening depression and anxiety symptoms that have been unrelieved by her outpatient regimen including ativan 0.5 mg TID, buspirone 10 BID, duloxetine 30 mg daily, mirtazapine 45 mg, and esketamine nasal spray. She presented to the [facility] ED on 03/21/2024 in an acutely anxious state. She was admitted for acute management of her depression, anxiety, and passive suicidal ideation.  Prior to the interview, the patient had had 1x 1 mg of ativan and 1x 25 mg hydroxyzine.  At time of interview, she denied SI, HI, AVH, Delusions, Paranoia. She endorsed passive SI prior to transfer to the psychiatric inpatient facility.  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 Patient began to feel more anxious and depressed as she lost one of the things  that gave her significant purpose in life.  Then in December, her mother whom she looked to for emotional support had a significant stroke with a prolonged recovery.   In April, her mother suffered another double-stroke which has now left her permanently debilitated. Patient feels powerless to help and adrift without emotional support. Patient's sisters, who are closer geographically to the mother, are helping manage those day to day logistics, but the patient feels a spiral of guilt about this.  Finally, she is going through significant life changes as her special needs son (age 30) prepares to graduate from high school this Friday, 6/13. Patient feels as though some of the difficult experiences and caregiving roles that are a part of her identity seem to have vanished.   She has experienced significant weight loss (decreased appetite and feelings of vague gastric distress), severely depressed mood, anhedonia, decrease in concentration, abulia, lapses in memory, feelings of guilt and worthlessness (but not powerless), and a decrease in sleep. In terms of anxiety symptoms, she has not had any panic attacks, but has had increased muscle tension, inability to stop crying, mood lability,  and an inability to stop worrying about different things.   She reports no symptoms of obsessions, she reports no intrusive thoughts, no nightmares or history of trauma. She denies delusions or unusual thoughts. She denies any history of hallucinations or paranoia. She reports that her life is organized, but she just feels profoundly sad and anxious.  Past Psychiatric Hx: Previous Psych Diagnoses: GAD, MDD Prior inpatient treatment: No  Current/prior outpatient treatment: Yes, Dr Deborra Falter Prior rehab hx: denies Psychotherapy hx: denies History of suicide: denies History of homicide or aggression: denies  Psychiatric medication history: escitalopram, buproprion 150, vilazodone, sertraline, esketamine, buspirone,  duloxetine, hydroxyzine, lorazepam Psychiatric medication compliance history: good Neuromodulation history: denies Current Psychiatrist:Phone: [phone] Fax [phone] Daphine Eagle, MD Current therapist: na  Substance Abuse Hx: Alcohol: drinks <1 glass of wine per week Tobacco: never Illicit drugs: never Rx drug abuse: denies, PDMP review conducted 03/22/2024 shows Average Risk. One prescriber.  Rehab hx: no  Past Medical History: Medical Diagnoses: HTN Home Rx: rosuvastatin  Prior Hosp: Prior Surgeries/Trauma: 2x c-sections, abdominal hernia repair Head trauma, LOC, concussions, seizures: Denies Allergies: Denies LMP: postmenopausal PCP: Rayma Calandra, [phone]  Family History: Medical: Mother has had 3x strokes, father passed away. Psych: Sister with severe anxiety. Maternal grandmother had bipolar disorder Psych Rx: Duloxetine 30 mg for sister seems to help SA/HA: denies Substance use family hx: denies  Social History: Childhood (bring, raised, lives now, parents, siblings, schooling, education): lives in [place], originally from [place] . Abuse: denies Marital Status: married 35 years.  Sexual orientation: straight Children: 2x sons. 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 Yes.     Has the patient been a risk to self in the past 6 months? No.  Has the patient been a risk to self within the distant past? No.  Is the patient a risk to others? No.  Has the patient been a risk to others in the past 6 months? No.  Has the patient been a risk to others within the distant past? No.   [place] Scale:  Flowsheet Row [facility]Current[facility] Most recent reading at 03/21/2024  6:00 PM ED from 03/21/2024 in [facility] Emergency Department at [facility] Most recent reading at 03/21/2024 10:18 AM  C-SSRS RISK CATEGORY No Risk No Risk        Prior Inpatient Therapy: No.   Prior Outpatient Therapy: Yes.   Sees psychiatrist   Alcohol Screening: Patient refused Alcohol Screening Tool: Yes 1. How often do you have a drink containing alcohol?: Never 2. How many drinks containing alcohol do you have on a typical day when you are drinking?: 1 or 2 3. How often do you have six or more drinks on one occasion?: Never AUDIT-C Score: 0 4. How often during the last year have you found that you were not able to stop drinking once you had started?: Never 5. How often during the last year have you failed to do what was normally expected from you because of drinking?: Never 6. How often during the last year have you needed a first drink in the morning to get yourself going after a heavy drinking session?: Never 7. How often during the last year have you had a feeling of guilt of remorse after drinking?: Never 8. How often during the last year have you been unable to remember what happened the night before because you had been drinking?: Never 9. Have you or  someone else been injured as a result of your drinking?: No 10. Has a relative or friend or a doctor or another health worker been concerned about your drinking or suggested you cut down?: No Alcohol Use Disorder Identification Test Final Score (AUDIT): 0 Alcohol Brief  Interventions/Follow-up: Patient Refused Substance Abuse History in the last 12 months:  No. 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 No pertinent surgical history. Family History:  Family History  Problem Relation Age of Onset   Heart disease Father        CABG   Heart attack Maternal Grandfather    Heart attack Paternal Grandfather    Family Psychiatric  History: see above Tobacco Screening:  Social History   Tobacco Use  Smoking Status Never  Smokeless Tobacco Never    BH Tobacco Counseling     Are you interested in Tobacco Cessation Medications?  N/A, patient does not use tobacco products Counseled patient on smoking cessation:  N/A, patient does not use tobacco products Reason Tobacco Screening Not Completed: No value filed.       Social History:  Social History   Substance and Sexual Activity  Alcohol Use No     Social History   Substance and Sexual Activity  Drug Use No    Additional Social History:     Allergies:  No Known Allergies Lab Results:  Results for orders placed or performed during the hospital encounter of 03/21/24 (from the past 48 hours)  Lipase, blood     Status: None   Collection Time: 03/21/24 10:35 AM  Result Value Ref Range   Lipase 26 11 - 51 U/L    Comment: Performed at [facility], [address] [place], [place] [address]  Comprehensive metabolic panel     Status: None   Collection Time: 03/21/24 10:35 AM  Result Value Ref Range   Sodium 138 135 - 145 mmol/L   Potassium 4.2 3.5 - 5.1 mmol/L   Chloride 105 98 - 111 mmol/L   CO2 25 22 - 32 mmol/L   Glucose, Bld 96 70 - 99 mg/dL    Comment: Glucose reference range applies only to samples taken after fasting for at least  8 hours.   BUN 14 6 - 20 mg/dL   Creatinine, Ser 3.24 0.44 - 1.00 mg/dL   Calcium 9.5 8.9 - 40.1 mg/dL   Total Protein 7.0 6.5 - 8.1 g/dL   Albumin 4.3 3.5 - 5.0 g/dL   AST 18 15 - 41 U/L   ALT 7 0 - 44 U/L   Alkaline Phosphatase 59 38 - 126 U/L   Total Bilirubin 0.6 0.0 - 1.2 mg/dL   GFR, Estimated >02 >72 mL/min    Comment: (NOTE) Calculated using the CKD-EPI Creatinine Equation (2021)    Anion gap 8 5 - 15    Comment: Performed at [facility], [address] [place], [place] [address]  TSH     Status: None   Collection Time: 03/21/24 10:35 AM  Result Value Ref Range   TSH 0.394 0.350 - 4.500 uIU/mL    Comment: Performed by a 3rd Generation assay with a functional sensitivity of <=0.01 uIU/mL. Performed at [facility], [address] [place], [place] [address]   Lipid panel     Status: Abnormal   Collection Time: 03/21/24 10:35 AM  Result Value Ref Range   Cholesterol 199 0 - 200 mg/dL   Triglycerides 50 <474 mg/dL   HDL 73 >25 mg/dL   Total CHOL/HDL Ratio 2.7 RATIO   VLDL 10 0 - 40 mg/dL   LDL Cholesterol 956 (H) 0 - 99 mg/dL    Comment:        Total Cholesterol/HDL:CHD Risk Coronary Heart Disease Risk Table                     Men   Women  1/2 Average Risk   3.4   3.3  Average Risk       5.0   4.4  2 X Average Risk   9.6   7.1  3 X Average Risk  23.4   11.0        Use the calculated Patient Ratio above and the CHD Risk Table to determine the patient's CHD Risk.        ATP III CLASSIFICATION (LDL):  <100     mg/dL   Optimal  387-564  mg/dL   Near or Above                    Optimal  130-159  mg/dL   Borderline  332-951  mg/dL   High  >884     mg/dL   Very High Performed at [facility], [address] [place], [place] [address]   CBC with Differential/Platelet     Status: None   Collection Time: 03/21/24 10:35 AM  Result Value Ref Range   WBC 6.1 4.0 - 10.5 K/uL   RBC 4.90 3.87 - 5.11 MIL/uL   Hemoglobin 14.6 12.0 - 15.0 g/dL   HCT  30.1 60.1 - 09.3 %   MCV 88.8 80.0 - 100.0 fL   MCH 29.8 26.0 - 34.0 pg   MCHC 33.6 30.0 - 36.0 g/dL   RDW 23.5 57.3 - 22.0 %   Platelets 261 150 - 400 K/uL   nRBC 0.0 0.0 - 0.2 %   Neutrophils Relative % 70 %   Neutro Abs 4.2 1.7 - 7.7 K/uL   Lymphocytes Relative 24 %   Lymphs Abs 1.5 0.7 - 4.0 K/uL   Monocytes Relative 6 %   Monocytes Absolute 0.4 0.1 - 1.0 K/uL   Eosinophils Relative 0 %   Eosinophils Absolute 0.0 0.0 - 0.5 K/uL   Basophils Relative 0 %   Basophils Absolute 0.0 0.0 - 0.1 K/uL   Immature Granulocytes 0 %   Abs Immature Granulocytes 0.01 0.00 - 0.07 K/uL    Comment: Performed at [facility], [address], [place], [place] [address]  Urinalysis, Routine w reflex microscopic -Urine, Clean Catch     Status: Abnormal   Collection Time: 03/21/24  2:08 PM  Result Value Ref Range   Color, Urine YELLOW YELLOW   APPearance HAZY (A) CLEAR   Specific Gravity, Urine 1.021 1.005 - 1.030   pH 5.0 5.0 - 8.0   Glucose, UA NEGATIVE NEGATIVE mg/dL   Hgb urine dipstick NEGATIVE NEGATIVE   Bilirubin Urine NEGATIVE NEGATIVE   Ketones, ur 20 (A) NEGATIVE mg/dL   Protein, ur NEGATIVE NEGATIVE mg/dL   Nitrite NEGATIVE NEGATIVE   Leukocytes,Ua NEGATIVE NEGATIVE    Comment: Performed at [facility], [address]  [place], [place] [address]  Rapid urine drug screen (hospital performed)     Status: Abnormal   Collection Time: 03/21/24  2:09 PM  Result Value Ref Range   Opiates NONE DETECTED NONE DETECTED   Cocaine NONE DETECTED NONE DETECTED   Benzodiazepines POSITIVE (A) NONE DETECTED   Amphetamines NONE DETECTED NONE DETECTED   Tetrahydrocannabinol NONE DETECTED NONE DETECTED   Barbiturates NONE DETECTED NONE DETECTED    Comment: (NOTE) DRUG SCREEN FOR MEDICAL PURPOSES ONLY.  IF CONFIRMATION IS NEEDED FOR ANY PURPOSE, NOTIFY LAB WITHIN 5 DAYS.  LOWEST DETECTABLE LIMITS FOR URINE DRUG SCREEN Drug Class                     Cutoff (ng/mL) Amphetamine and  metabolites    1000 Barbiturate and metabolites    200 Benzodiazepine                 200 Opiates and metabolites        300 Cocaine and metabolites        300 THC                            50 Performed at [facility], [address], [place], [place] [address]     Blood Alcohol level:  No results found for: [facility]  Metabolic Disorder Labs:  No results found for: HGBA1C, MPG No results found for: PROLACTIN Lab Results  Component Value Date   CHOL 199 03/21/2024   TRIG 50 03/21/2024   HDL 73 03/21/2024   CHOLHDL 2.7 03/21/2024   VLDL 10 03/21/2024   LDLCALC 116 (H) 03/21/2024    Current Medications: Current Facility-Administered Medications  Medication Dose Route Frequency Provider Last Rate Last Admin   acetaminophen (TYLENOL) tablet 650 mg  650 mg Oral Q6H PRN Roise Cleaver, NP       alum & mag hydroxide-simeth (MAALOX/MYLANTA) 200-200-20 MG/5ML suspension 30 mL  30 mL Oral Q4H PRN Roise Cleaver, NP       feeding supplement (ENSURE PLUS HIGH PROTEIN) liquid 237 mL  237 mL Oral BID BM Ntuen, Tina C, FNP   237 mL at 03/22/24 1447   hydrOXYzine (ATARAX) tablet 25 mg  25 mg Oral TID PRN Roise Cleaver, NP   25 mg at 03/22/24 1818   LORazepam (ATIVAN) tablet 1 mg  1 mg Oral Q6H PRN Roise Cleaver, NP   1 mg at 03/22/24 1548   magnesium hydroxide (MILK OF MAGNESIA) suspension 30 mL  30 mL Oral Daily PRN Roise Cleaver, NP       OLANZapine (ZYPREXA) injection 10 mg  10 mg Intramuscular TID PRN Roise Cleaver, NP       OLANZapine (ZYPREXA) injection 5 mg  5 mg Intramuscular TID PRN Roise Cleaver, NP       OLANZapine zydis (ZYPREXA) disintegrating tablet 5 mg  5 mg Oral TID PRN Roise Cleaver, NP       traZODone (DESYREL) tablet 100 mg  100 mg Oral QHS PRN Lundy Salisbury, MD       [START ON 03/23/2024] venlafaxine XR (EFFEXOR-XR) 24 hr capsule 75 mg  75 mg Oral Q breakfast Lundy Salisbury, MD       PTA  Medications: Medications Prior to Admission  Medication Sig Dispense Refill Last Dose/Taking   LORazepam (ATIVAN) 0.5 MG tablet Take 1 tablet by mouth 3 (three) times daily as needed.   03/21/2024  rosuvastatin (CRESTOR) 5 MG tablet Take 5 mg by mouth daily.   Past Month   busPIRone (BUSPAR) 10 MG tablet Take 1 tablet by mouth 2 (two) times daily. (Patient not taking: Reported on 03/22/2024)   Not Taking   DULoxetine (CYMBALTA) 30 MG capsule Take 30 mg by mouth daily. (Patient not taking: Reported on 03/22/2024)   Not Taking   Esketamine HCl, 84 MG Dose, (SPRAVATO, 84 MG DOSE,) 28 MG/DEVICE SOPK Place 84 mg into the nose 2 (two) times a week. (Patient not taking: Reported on 03/22/2024)   Not Taking   mirtazapine (REMERON) 45 MG tablet Take 45 mg by mouth at bedtime. (Patient not taking: Reported on 03/22/2024)   Not Taking    Musculoskeletal: Strength & Muscle Tone: within normal limits Gait & Station: normal Patient leans: N/A  Psychiatric Specialty Exam:  Presentation  General Appearance: Disheveled  Eye Contact:Good  Speech:Clear and Coherent; Normal Rate  Speech Volume:Normal  Handedness:No data recorded  Mood and Affect  Mood:Depressed; Anxious; Hopeless  Affect:Tearful; Congruent; Depressed   Thought Process  Thought Processes:Disorganized  Duration of Psychotic Symptoms:N/A Past Diagnosis of Schizophrenia or Psychoactive disorder: No data recorded Descriptions of Associations:Circumstantial  Orientation:Full (Time, Place and Person)  Thought Content:Abstract Reasoning; Logical  Hallucinations:Hallucinations: None  Ideas of Reference:None  Suicidal Thoughts:Suicidal Thoughts: No  Homicidal Thoughts:Homicidal Thoughts: No   Sensorium  Memory:Immediate Good; Recent Good; Remote Good  Judgment:Good  Insight:Good   Executive Functions  Concentration:Fair  Attention Span:Fair  Recall:Fair  Fund of Knowledge:Good  Language:Good   Psychomotor  Activity  Psychomotor Activity:Psychomotor Activity: Normal   Assets  Assets:Communication Skills; Desire for Improvement; Financial Resources/Insurance; Housing; Intimacy; Physical Health; Social Support; Talents/Skills; Vocational/Educational   Sleep  Sleep:Sleep: Fair    Physical Exam: Physical Exam Vitals and nursing note reviewed.  Constitutional:      General: She is not in acute distress.    Appearance: Normal appearance. She is not ill-appearing.  HENT:     Head: Normocephalic and atraumatic.  Pulmonary:     Effort: Pulmonary effort is normal.  Neurological:     Mental Status: She is alert and oriented to person, place, and time.      Review of Systems  Constitutional:  Positive for weight loss.  HENT: Negative.    Eyes: Negative.   Respiratory: Negative.    Cardiovascular: Negative.   Gastrointestinal:  Positive for nausea.  Genitourinary: Negative.   Skin: Negative.   Neurological: Negative.   Endo/Heme/Allergies: Negative.   Psychiatric/Behavioral:  Positive for depression. Negative for hallucinations, memory loss, substance abuse and suicidal ideas. The patient is nervous/anxious and has insomnia.     Blood pressure 100/66, pulse 80, temperature 97.6 F (36.4 C), temperature source Oral, resp. rate 14, height 5' 9 (1.753 m), weight 68.9 kg, last menstrual period 01/27/2012, SpO2 98%. Body mass index is 22.45 kg/m.   Treatment Plan Summary: Daily contact with patient to assess and evaluate symptoms and progress in treatment and Medication management  Observation Level/Precautions:  15 minute checks  Laboratory:  Vitamin B-12 Vitamin d  Psychotherapy:  Needs outpatient therapist  Medications:  Trazodone 100 mg, ativan 1 mg TID, venlafaxine 75 mg, hydroxyzine 25 mg TID PRN  Consultations:    Discharge Concerns: safety planning   Estimated LOS: 3-4 days  Other:     Physician Treatment Plan for Primary Diagnosis: GAD (generalized anxiety  disorder) Long Term Goal(s): Improvement in symptoms so as ready for discharge  Short Term Goals: Ability to verbalize  feelings will improve, Ability to identify and develop effective coping behaviors will improve, and Ability to identify triggers associated with substance abuse/mental health issues will improve  Physician Treatment Plan for Secondary Diagnosis: Principal Problem:   GAD (generalized anxiety disorder)  Long Term Goal(s): Improvement in symptoms so as ready for discharge  Short Term Goals: Ability to identify changes in lifestyle to reduce recurrence of condition will improve, Ability to verbalize feelings will improve, and Ability to identify triggers associated with substance abuse/mental health issues will improve  I certify that inpatient services furnished can reasonably be expected to improve the patient's condition.    Lundy Salisbury, MD 6/10/20256:52 PM

## 2024-03-22 NOTE — Plan of Care (Signed)
  Problem: Education: Goal: Mental status will improve Outcome: Progressing Goal: Verbalization of understanding the information provided will improve Outcome: Progressing   Problem: Coping: Goal: Ability to verbalize frustrations and anger appropriately will improve Outcome: Progressing   Problem: Health Behavior/Discharge Planning: Goal: Compliance with treatment plan for underlying cause of condition will improve Outcome: Progressing

## 2024-03-22 NOTE — [facility] Suicide Risk Assessment (Cosign Needed Addendum)
 Suicide Risk Assessment  Admission Assessment    [facility] Admission Suicide Risk Assessment   Nursing information obtained from:  Patient Demographic factors:  Caucasian, Unemployed Current Mental Status:  NA Loss Factors:  Decrease in vocational status Historical Factors:  NA Risk Reduction Factors:  Positive social support  Total Time spent with patient: 1 hour Principal Problem: GAD (generalized anxiety disorder) Diagnosis:  Principal Problem:   GAD (generalized anxiety disorder)  Subjective Data: Brenda Martinez is a 58 year old female with past psychiatric history significant for major depressive disorder and generalized anxiety disorder presenting for 6-8 weeks of worsening depression and anxiety symptoms that have been unrelieved by her outpatient regimen including ativan 0.5 mg TID, buspirone 10 BID, duloxetine 30 mg daily, mirtazapine 45 mg, and esketamine nasal spray. She presented to the [facility] ED on 03/21/2024 in an acutely anxious state. She was admitted for acute management of her depression, anxiety, and passive suicidal ideation.  Prior to the interview, the patient had had 1x 1 mg of ativan and 1x 25 mg hydroxyzine.  At time of interview, she denied SI, HI, AVH, Delusions, Paranoia. She endorsed passive SI prior to transfer to the psychiatric inpatient facility.  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 Patient began to feel more anxious and depressed as she lost one of the things that gave her significant purpose in life.  Then in December, her mother whom she looked to for emotional support had a significant stroke with a prolonged recovery.   In April, her mother suffered another double-stroke which has now left her permanently debilitated. Patient feels powerless to help and adrift without emotional support. Patient's sisters, who are closer  geographically to the mother, are helping manage those day to day logistics, but the patient feels a spiral of guilt about this.  Finally, she is going through significant life changes as her special needs son (age 69) prepares to graduate from high school this Friday, 6/13. Patient feels as though some of the difficult experiences and caregiving roles that are a part of her identity seem to have vanished.    Continued Clinical Symptoms:  Alcohol Use Disorder Identification Test Final Score (AUDIT): 0 The Alcohol Use Disorders Identification Test, Guidelines for Use in Primary Care, Second Edition.  World Science writer [facility]). Score between 0-7:  no or low risk or alcohol related problems. Score between 8-15:  moderate risk of alcohol related problems. Score between 16-19:  high risk of alcohol related problems. Score 20 or above:  warrants further diagnostic evaluation for alcohol dependence and treatment.   CLINICAL FACTORS:   Severe Anxiety and/or Agitation Depression:   Anhedonia Hopelessness Insomnia Severe Previous Psychiatric Diagnoses and Treatments   Musculoskeletal: Strength & Muscle Tone: within normal limits Gait & Station: normal Patient leans: N/A  Psychiatric Specialty Exam:  Presentation  General Appearance: Disheveled  Eye Contact:Good  Speech:Clear and Coherent; Normal Rate  Speech Volume:Normal  Handedness:No data recorded  Mood and Affect  Mood:Depressed; Anxious; Hopeless  Affect:Tearful; Congruent; Depressed   Thought Process  Thought Processes:Disorganized  Descriptions of Associations:Circumstantial  Orientation:Full (Time, Place and Person)  Thought Content:Abstract Reasoning; Logical  History of Schizophrenia/Schizoaffective disorder:No data recorded Duration of Psychotic Symptoms:No data recorded Hallucinations:Hallucinations: None  Ideas of Reference:None  Suicidal Thoughts:Suicidal Thoughts: No  Homicidal  Thoughts:Homicidal Thoughts: No   Sensorium  Memory:Immediate Good; Recent Good; Remote  Good  Judgment:Good  Insight:Good   Executive Functions  Concentration:Fair  Attention Span:Fair  Recall:Fair  Fund of Knowledge:Good  Language:Good   Psychomotor Activity  Psychomotor Activity:Psychomotor Activity: Normal   Assets  Assets:Communication Skills; Desire for Improvement; Financial Resources/Insurance; Housing; Intimacy; Physical Health; Social Support; Talents/Skills; Vocational/Educational   Sleep  Sleep:Sleep: Fair    Physical Exam: Physical Exam Vitals and nursing note reviewed.  Constitutional:      General: She is not in acute distress.    Appearance: Normal appearance. She is not ill-appearing.  HENT:     Head: Normocephalic and atraumatic.  Pulmonary:     Effort: Pulmonary effort is normal.  Neurological:     Mental Status: She is alert and oriented to person, place, and time.    Review of Systems  Constitutional:  Positive for weight loss.  HENT: Negative.    Eyes: Negative.   Respiratory: Negative.    Cardiovascular: Negative.   Gastrointestinal:  Positive for nausea.  Genitourinary: Negative.   Skin: Negative.   Neurological: Negative.   Endo/Heme/Allergies: Negative.   Psychiatric/Behavioral:  Positive for depression. Negative for hallucinations, memory loss, substance abuse and suicidal ideas. The patient is nervous/anxious and has insomnia.    Blood pressure 105/63, pulse 79, temperature 97.6 F (36.4 C), temperature source Oral, resp. rate 14, height 5' 9 (1.753 m), weight 68.9 kg, last menstrual period 01/27/2012, SpO2 97%. Body mass index is 22.45 kg/m.   COGNITIVE FEATURES THAT CONTRIBUTE TO RISK:  None    SUICIDE RISK:  Mild:  There are no identifiable suicide plans, no associated intent, mild dysphoria and related symptoms, good self-control (both objective and subjective assessment), few other risk factors, and identifiable  protective factors, including available and accessible social support.   PLAN OF CARE: See H&P  I certify that inpatient services furnished can reasonably be expected to improve the patient's condition.   Lundy Salisbury, MD 03/22/2024, 7:00 PM

## 2024-03-22 NOTE — Progress Notes (Signed)
   03/22/24 0730  Psych Admission Type (Psych Patients Only)  Admission Status Voluntary  Psychosocial Assessment  Patient Complaints Anxiety;Depression  Eye Contact Fair  Facial Expression Anxious  Affect Depressed  Speech Logical/coherent  Interaction Assertive  Motor Activity Slow  Appearance/Hygiene In scrubs  Behavior Characteristics Cooperative  Mood Pleasant  Thought Process  Coherency WDL  Content WDL  Delusions None reported or observed  Perception WDL  Hallucination None reported or observed  Judgment Poor  Confusion None  Danger to Self  Current suicidal ideation? Denies  Danger to Others  Danger to Others None reported or observed

## 2024-03-22 NOTE — Group Note (Signed)
 Recreation Therapy Group Note   Group Topic:Animal Assisted Therapy   Group Date: 03/22/2024 Start Time: 0946 End Time: 1030 Facilitators: Ko Bardon-McCall, LRT,CTRS Location: [address] Dayroom   Animal-Assisted Activity (AAA) Program Checklist/Progress Notes Patient Eligibility Criteria Checklist & Daily Group note for Rec Tx Intervention  AAA/T Program Assumption of Risk Form signed by Patient/ or Parent Legal Guardian Yes  Patient is free of allergies or severe asthma Yes  Patient reports no fear of animals Yes  Patient reports no history of cruelty to animals Yes  Patient understands his/her participation is voluntary Yes  Patient washes hands before animal contact Yes  Patient washes hands after animal contact Yes  Behavioral Response: Engaged   Education: Charity fundraiser, Appropriate Animal Interaction   Education Outcome: Acknowledges education.    Affect/Mood: Appropriate   Participation Level: Engaged   Participation Quality: Independent   Behavior: Appropriate   Speech/Thought Process: Focused   Insight: Good   Judgement: Good   Modes of Intervention: Teaching laboratory technician   Patient Response to Interventions:  Engaged   Education Outcome:  In group clarification offered    Clinical Observations/Individualized Feedback: Patient attended session and interacted appropriately with therapy dog and peers. Patient asked appropriate questions about therapy dog and his training. Patient shared stories about their pets at home with group.   Plan: Continue to engage patient in RT group sessions 2-3x/week.   Thea Holshouser-McCall, LRT,CTRS 03/22/2024 12:59 PM

## 2024-03-22 NOTE — Progress Notes (Signed)
   03/21/24 2300  Psych Admission Type (Psych Patients Only)  Admission Status Voluntary  Psychosocial Assessment  Patient Complaints Anxiety;Depression  Eye Contact Fair  Facial Expression Anxious  Affect Depressed  Speech Logical/coherent  Interaction Assertive  Motor Activity Slow  Appearance/Hygiene In scrubs  Behavior Characteristics Cooperative;Appropriate to situation  Mood Pleasant  Thought Process  Coherency WDL  Content WDL  Delusions None reported or observed  Perception WDL  Hallucination None reported or observed  Judgment Poor  Confusion None  Danger to Self  Current suicidal ideation? Denies  Danger to Others  Danger to Others None reported or observed

## 2024-03-22 NOTE — Group Note (Signed)
 LCSW Group Therapy Note   Group Date: 03/22/2024 Start Time: 1100 End Time: 1200   Participation:  patient was present and actively participated in the discussion  Type of Therapy:  Group Therapy  Topic:  Speaking from the Heart: Communicating with Understanding and Empathy  Objective:  To help participants develop effective communication skills to express themselves clearly, listen actively, and navigate conflicts in a healthy way.  Goals: Increase awareness of verbal and non-verbal communication skills. Practice using "I" statements and active listening techniques. Learn coping strategies for managing communication stress.  Summary:  Participants explored the importance of communication, discussed challenges, and practiced skills such as active listening and assertive expression. They reflected on past experiences and identified ways to improve communication in their daily lives.  Therapeutic Modalities: Cognitive-Behavioral Therapy (CBT): Restructuring negative thought patterns in communication. Mindfulness: Staying present and calm during conversations. Psychoeducation: Learning about effective communication techniques.   Brenda Martinez Brenda Martinez, LCSWA 03/22/2024  5:48 PM

## 2024-03-22 NOTE — Group Note (Signed)
 Date:  03/22/2024 Time:  9:03 AM  Group Topic/Focus:  Goals Group:   The focus of this group is to help patients establish daily goals to achieve during treatment and discuss how the patient can incorporate goal setting into their daily lives to aide in recovery. Orientation:   The focus of this group is to educate the patient on the purpose and policies of crisis stabilization and provide a format to answer questions about their admission.  The group details unit policies and expectations of patients while admitted.    Participation Level:  Active  Participation Quality:  Appropriate  Affect:  Appropriate  Cognitive:  Appropriate  Insight: Appropriate  Engagement in Group:  Engaged  Modes of Intervention:  Socialization  Additional Comments:  Goal is to talk with dr  Brenda Martinez 03/22/2024, 9:03 AM

## 2024-03-22 NOTE — Plan of Care (Signed)

## 2024-03-23 ENCOUNTER — Encounter (HOSPITAL_COMMUNITY): Payer: Self-pay

## 2024-03-23 MED ORDER — HYDROXYZINE HCL 50 MG PO TABS
50.0000 mg | ORAL_TABLET | Freq: Three times a day (TID) | ORAL | Status: DC | PRN
  Administered 2024-03-23 – 2024-03-26 (×6): 50 mg via ORAL
  Filled 2024-03-23 (×6): qty 1

## 2024-03-23 MED ORDER — ARIPIPRAZOLE 5 MG PO TABS
5.0000 mg | ORAL_TABLET | Freq: Once | ORAL | Status: AC
  Administered 2024-03-23: 5 mg via ORAL
  Filled 2024-03-23: qty 1

## 2024-03-23 NOTE — Progress Notes (Signed)
   03/23/24 0930  Psych Admission Type (Psych Patients Only)  Admission Status Voluntary  Psychosocial Assessment  Patient Complaints Anxiety;Depression  Eye Contact Fair  Facial Expression Animated  Affect Anxious  Speech Logical/coherent  Interaction Assertive  Motor Activity Slow  Appearance/Hygiene Unremarkable  Behavior Characteristics Cooperative;Appropriate to situation  Mood Anxious;Pleasant  Thought Process  Coherency WDL  Content WDL  Delusions None reported or observed  Perception WDL  Hallucination None reported or observed  Judgment Poor  Confusion None  Danger to Self  Current suicidal ideation? Denies  Danger to Others  Danger to Others None reported or observed

## 2024-03-23 NOTE — Plan of Care (Signed)
  Problem: Education: Goal: Emotional status will improve Outcome: Progressing Goal: Mental status will improve Outcome: Progressing   Problem: Activity: Goal: Interest or engagement in activities will improve Outcome: Progressing Goal: Sleeping patterns will improve Outcome: Progressing   Problem: Physical Regulation: Goal: Ability to maintain clinical measurements within normal limits will improve Outcome: Progressing   Problem: Safety: Goal: Periods of time without injury will increase Outcome: Progressing

## 2024-03-23 NOTE — Progress Notes (Signed)
   03/22/24 2230  Psych Admission Type (Psych Patients Only)  Admission Status Voluntary  Psychosocial Assessment  Patient Complaints Anxiety;Depression  Eye Contact Fair  Facial Expression Animated  Affect Anxious  Speech Logical/coherent  Interaction Assertive  Motor Activity Slow  Appearance/Hygiene Unremarkable  Behavior Characteristics Cooperative  Mood Anxious;Pleasant  Thought Process  Coherency WDL  Content WDL  Delusions None reported or observed  Perception WDL  Hallucination None reported or observed  Judgment Poor  Confusion None  Danger to Self  Current suicidal ideation? Denies  Danger to Others  Danger to Others None reported or observed

## 2024-03-23 NOTE — [facility] Suicide Risk Assessment (Signed)
 [facility] INPATIENT:  Family/Significant Other Suicide Prevention Education  Suicide Prevention Education:  Education Completed; [name] - spouse [phone]), has been identified by the patient as the family member/significant other with whom the patient will be residing, and identified as the person(s) who will aid the patient in the event of a mental health crisis (suicidal ideations/suicide attempt).  With written consent from the patient, the family member/significant other has been provided the following suicide prevention education, prior to the and/or following the discharge of the patient.  Information provided from Spouse is consistent with that received from patient. [name] further confirms that there are no weapons in the home and he is able to provide appropriate support. Discussed the possibility of Pt leaving tomorrow - as reported by physician, however this would not be certain until tomorrow. Spouse would be the one providing transportation at discharge. He reports no concerns regarding current medication management services and he too believes that therapy (and integration of physical activity) would be beneficial for Pt. Informed spouse that therapy information will be provided and Pt can further scheduled, since she did request ability to choose clinician independently.   The suicide prevention education provided includes the following: Suicide risk factors Suicide prevention and interventions National Suicide Hotline telephone number [facility] assessment telephone number [facility] Emergency Assistance [facility] and/or Residential Mobile Crisis Unit telephone number  Request made of family/significant other to: Remove weapons (e.g., guns, rifles, knives), all items previously/currently identified as safety concern.   Remove drugs/medications (over-the-counter, prescriptions, illicit drugs), all items previously/currently identified as a safety  concern.  The family member/significant other verbalizes understanding of the suicide prevention education information provided.  The family member/significant other agrees to remove the items of safety concern listed above.  Brenda Martinez N Brenda Johal, LCSW 03/23/2024, 7:21 PM

## 2024-03-23 NOTE — [facility] Group Notes (Signed)
 Spirituality Group   Focus of discussion: Gratitude and Strength Awareness   Process: Following theoretical framework of group therapy of Irvin Yalom and further informed by Rogerian and Relational Cultural Theory approaches, participants invited to name:   Sources of gratitude (internal>external)   Articulate gratitude for self   Name a personal strength/gift/skill   Locate points of resonance among group members/engage the here and now   Conclude with grounding/breathwork       Observations: Ethelean took an opportunity to debrief some before group started, she was rather emotional and also noted feeling gratitude for receiving some peer support earlier. Will recommend for f/u support.  Shaka Cardin L. Minetta Aly, M.Div [phone]

## 2024-03-23 NOTE — Progress Notes (Signed)
   03/23/24 2015  Psych Admission Type (Psych Patients Only)  Admission Status Voluntary  Psychosocial Assessment  Patient Complaints Anxiety;Depression  Eye Contact Fair  Facial Expression Anxious  Affect Anxious  Speech Logical/coherent  Interaction Assertive  Motor Activity Slow  Appearance/Hygiene Unremarkable  Behavior Characteristics Cooperative  Mood Anxious;Pleasant  Aggressive Behavior  Effect No apparent injury  Thought Process  Coherency WDL  Content WDL  Delusions WDL  Perception WDL  Hallucination None reported or observed  Judgment Poor  Confusion WDL  Danger to Self  Current suicidal ideation? Denies  Danger to Others  Danger to Others None reported or observed

## 2024-03-23 NOTE — Plan of Care (Signed)
  Problem: Education: Goal: Emotional status will improve Outcome: Progressing Goal: Mental status will improve Outcome: Progressing Goal: Verbalization of understanding the information provided will improve Outcome: Progressing   Problem: Activity: Goal: Interest or engagement in activities will improve Outcome: Progressing   Problem: Safety: Goal: Periods of time without injury will increase Outcome: Progressing

## 2024-03-23 NOTE — Group Note (Signed)
 Date:  03/23/2024 Time:  5:12 AM  Group Topic/Focus:  Wrap-Up Group:   The focus of this group is to help patients review their daily goal of treatment and discuss progress on daily workbooks.    Participation Level:  Active  Participation Quality:  Appropriate and Sharing  Affect:  Appropriate, Flat, and Tearful  Cognitive:  Appropriate  Insight: Appropriate  Engagement in Group:  Engaged  Modes of Intervention:  Socialization  Additional Comments:  Patient used wrap up group sheet as guide when sharing. Patient rated her day a 3 out of 10. Patient goal for today, to not be nervous about meeting my doctor and patient did achieve goal, yes. Coping skills the patient finds most helpful, being around other people. Something the patient likes about herself. I'm kind to others. Patient did become emotional and tearful while sharing. Patient did not have any questions or concerns.   Brenda Martinez 03/23/2024, 5:12 AM

## 2024-03-23 NOTE — Group Note (Signed)
 Recreation Therapy Group Note   Group Topic:Team Building  Group Date: 03/23/2024 Start Time: 0945 End Time: 1005 Facilitators: [place] Basham-McCall, LRT,CTRS Location: [address] Dayroom   Group Topic: Communication, Team Building, Problem Solving  Goal Area(s) Addresses:  Patient will effectively work with peer towards shared goal.  Patient will identify skills used to make activity successful.  Patient will identify how skills used during activity can be used to reach post d/c goals.   Behavioral Response: Engaged  Intervention: STEM Activity  Activity: Stage manager. In teams of 3-5, patients were given 12 plastic drinking straws and an equal length of masking tape. Using the materials provided, patients were asked to build a landing pad to catch a golf ball dropped from approximately 5 feet in the air. All materials were required to be used by the team in their design. LRT facilitated post-activity discussion.  Education: Pharmacist, community, Scientist, physiological, Discharge Planning   Education Outcome: Acknowledges education/In group clarification offered/Needs additional education.    Affect/Mood: Appropriate   Participation Level: Engaged   Participation Quality: Independent   Behavior: Appropriate   Speech/Thought Process: Focused   Insight: Good   Judgement: Good   Modes of Intervention: STEM Activity   Patient Response to Interventions:  Engaged   Education Outcome:  In group clarification offered    Clinical Observations/Individualized Feedback: Pt attended and participated in group session.      Plan: Continue to engage patient in RT group sessions 2-3x/week.   Brenda Martinez, LRT,CTRS  03/23/2024 1:22 PM

## 2024-03-23 NOTE — Progress Notes (Signed)
 [facility] MD Progress Note 03/23/2024 5:14 PM Brenda Martinez  MRN:  [id number] Principal Problem: GAD (generalized anxiety disorder) Diagnosis: Principal Problem:   GAD (generalized anxiety disorder)   Subjective:   Brenda Martinez is a 58 year old female with past psychiatric history significant for major depressive disorder and generalized anxiety disorder presenting for 6-8 weeks of worsening depression and anxiety symptoms that have been unrelieved by her outpatient regimen including ativan 0.5 mg TID, buspirone 10 BID, duloxetine 30 mg daily, mirtazapine 45 mg, and esketamine nasal spray. She presented to the [facility] ED on 03/21/2024 in an acutely anxious state. She was admitted for acute management of her depression, anxiety, and passive suicidal ideation.   Case was discussed in the multidisciplinary team. MAR was reviewed and patient is compliant with medications.   Interval History: Psychiatric Team made the following recommendations yesterday:  - Increase Trazodone 100 mg,  - Increase ativan 1 mg TID,  - Start venlafaxine 75 mg,  - Start hydroxyzine 25 mg TID PRN   PRNs in last 24 hours: Med    Dose   Date Time(s)  Hydroxyzine  25 mg 6/10, 6/11 1207, 1818, 0722 Lorazepam   1 mg 6/10, 6/11 0754, 1548, 1248 Trazodone  100 mg 6/10 2053  Per nursing staff:  Patient remains active, social on the unit. Attending all groups. Quite frequently tearful when talking with other patients. Patient comes to window the minute her PRN is allowed.  Per Patient:  On assessment today, the patient reports good sleep, some improvements to depression, but rates her anxiety as unchanged. She was anxious that she had not been started on the right medications for the day (reassured her that the medications had been reviewed several times prior to us  sending them to the pharmacy, which was true). During the interview she got up to hug her roommate (of one night) goodbye as the roommate was discharging. Pt  denies SI/HI/AVH/Paranoia/Delusions.   Sleep: Good Appetite:  Fair Depression: Moderate Anxiety: High Auditory Hallucinations: denied Visual Hallucinations: denied Paranoia: denied HI: denied SI: denied Side effects from medications: does not endorse any side-effects they attribute to medications. Other concerns discussed with patient: Discussed how much of her identity has been built around caring for and serving others and that some aspects of that identity have been threatened with her major life changes. Discussed the value of logotherapy and considering specific ways to begin to give her a why to work towards.  Review of Systems  Constitutional: Negative.   HENT: Negative.    Eyes: Negative.   Respiratory: Negative.    Cardiovascular: Negative.   Gastrointestinal: Negative.   Genitourinary: Negative.   Musculoskeletal: Negative.   Skin: Negative.   Neurological: Negative.   Endo/Heme/Allergies: Negative.   Psychiatric/Behavioral:  Positive for depression. Negative for hallucinations, memory loss, substance abuse and suicidal ideas. The patient is nervous/anxious. The patient does not have insomnia.     Total time spent with patient: 30 minutes  PPast Psychiatric Hx: Previous Psych Diagnoses: GAD, MDD Prior inpatient treatment: No  Current/prior outpatient treatment: Yes, Dr Deborra Falter Prior rehab hx: denies Psychotherapy hx: denies History of suicide: denies History of homicide or aggression: denies Psychiatric medication history: escitalopram, buproprion 150, vilazodone, sertraline, esketamine, buspirone, duloxetine, hydroxyzine, lorazepam Psychiatric medication compliance history: good Neuromodulation history: denies Current Psychiatrist:Phone: [phone] Fax [phone] Daphine Eagle, MD Current therapist: na   Substance Abuse Hx: Alcohol: drinks <1 glass of wine per week Tobacco: never Illicit drugs: never  Rx drug abuse: denies, PDMP review conducted  03/22/2024 shows Average Risk. One prescriber.  Rehab hx: no   Past Medical History: Medical Diagnoses: HTN Home Rx: rosuvastatin  Prior Hosp: Prior Surgeries/Trauma: 2x c-sections, abdominal hernia repair Head trauma, LOC, concussions, seizures: Denies Allergies: Denies LMP: postmenopausal PCP: Rayma Calandra, [phone]   Family History: Medical: Mother has had 3x strokes, father passed away. Psych: Sister with severe anxiety. Maternal grandmother had bipolar disorder Psych Rx: Duloxetine 30 mg for sister seems to help SA/HA: denies Substance use family hx: denies   Social History: Childhood (bring, raised, lives now, parents, siblings, schooling, education): lives in [place], originally from [place] . Abuse: denies Marital Status: married 35 years.  Sexual orientation: straight Children: 2x sons. 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 No pertinent surgical history.  Social History:  Social History   Substance and Sexual Activity  Alcohol Use No     Social History   Substance and Sexual Activity  Drug Use No    Social History   Socioeconomic History   Marital status: Married    Spouse name: Not on file   Number of children: Not on file   Years of education: Not on file   Highest education level: Not on file  Occupational History   Not on file  Tobacco Use   Smoking status: Never   Smokeless tobacco: Never  Vaping Use   Vaping status: Never Used  Substance and Sexual Activity   Alcohol use: No   Drug use: No    Sexual activity: Yes    Birth control/protection: Pill  Other Topics Concern   Not on file  Social History Narrative   Not on file   Social Drivers of Health   Financial Resource Strain: Not on file  Food Insecurity: No Food Insecurity (03/21/2024)   Hunger Vital Sign    Worried About Running Out of Food in the Last Year: Never true    Ran Out of Food in the Last Year: Never true  Transportation Needs: No Transportation Needs (03/21/2024)   PRAPARE - Administrator, Civil Service (Medical): No    Lack of Transportation (Non-Medical): No  Physical Activity: Not on file  Stress: Not on file  Social Connections: Not on file   Additional Social History:   Objective: Medications, Labs, Mental Status Exam, Physical Exam Current Medications: Current Facility-Administered Medications  Medication Dose Route Frequency Provider Last Rate Last Admin   acetaminophen (TYLENOL) tablet 650 mg  650 mg Oral Q6H PRN Roise Cleaver, NP       alum & mag hydroxide-simeth (MAALOX/MYLANTA) 200-200-20 MG/5ML suspension 30 mL  30 mL Oral Q4H PRN Roise Cleaver, NP       feeding supplement (ENSURE PLUS HIGH PROTEIN) liquid 237 mL  237 mL Oral BID BM Ntuen, Tina C, FNP   237 mL at 03/23/24 1453   hydrOXYzine (ATARAX) tablet 25 mg  25 mg Oral TID PRN Roise Cleaver, NP   25 mg at 03/23/24 0722   LORazepam (  ATIVAN) tablet 1 mg  1 mg Oral Q6H PRN Roise Cleaver, NP   1 mg at 03/23/24 1248   magnesium hydroxide (MILK OF MAGNESIA) suspension 30 mL  30 mL Oral Daily PRN Roise Cleaver, NP       OLANZapine (ZYPREXA) injection 10 mg  10 mg Intramuscular TID PRN Roise Cleaver, NP       OLANZapine (ZYPREXA) injection 5 mg  5 mg Intramuscular TID PRN Roise Cleaver, NP       OLANZapine zydis (ZYPREXA) disintegrating tablet 5 mg  5 mg Oral TID PRN Roise Cleaver, NP       traZODone (DESYREL) tablet 100 mg  100 mg Oral QHS PRN Lundy Salisbury, MD   100 mg at 03/22/24 2053    venlafaxine XR (EFFEXOR-XR) 24 hr capsule 75 mg  75 mg Oral Q breakfast Lundy Salisbury, MD   75 mg at 03/23/24 1610    Lab Results:  No results found for this or any previous visit (from the past 48 hours).  Blood Alcohol level:  No results found for: [facility]  Metabolic Disorder Labs: No results found for: HGBA1C, MPG No results found for: PROLACTIN Lab Results  Component Value Date   CHOL 199 03/21/2024   TRIG 50 03/21/2024   HDL 73 03/21/2024   CHOLHDL 2.7 03/21/2024   VLDL 10 03/21/2024   LDLCALC 116 (H) 03/21/2024    Physical Findings: AIMS:  , ,  ,  ,    CIWA:    COWS:     Musculoskeletal: Strength & Muscle Tone: within normal limits Gait & Station: normal Patient leans: N/A  Mental Status Exam: General Appearance: Casual  Orientation:  Full (Time, Place, and Person)  Memory:  Immediate;   Good Recent;   Good Remote;   Good  Concentration:  Concentration: Good  Recall:  Good  Attention  Good  Eye Contact:  Good  Speech:  Clear and Coherent and Normal Rate  Language:  Good  Volume:  Normal  Mood: I still feel very anxious  Affect:  Appropriate, Congruent, and Tearful  Thought Process:  Coherent  Thought Content:  NA  Suicidal Thoughts:  No  Homicidal Thoughts:  No  Judgement:  Good  Insight:  Good  Psychomotor Activity:  Normal  Akathisia:  No  Fund of Knowledge:  Good      Assets:  Communication Skills Desire for Improvement Financial Resources/Insurance Housing Intimacy Physical Health Social Support Talents/Skills Vocational/Educational  Cognition:  WNL  ADL's:  Impaired   Physical Exam: Physical Exam Vitals and nursing note reviewed.  HENT:     Head: Normocephalic and atraumatic.  Pulmonary:     Effort: Pulmonary effort is normal.  Skin:    General: Skin is warm and dry.     Capillary Refill: Capillary refill takes less than 2 seconds.  Neurological:     General: No focal deficit present.     Mental  Status: She is alert and oriented to person, place, and time.  Psychiatric:        Attention and Perception: Attention and perception normal.        Mood and Affect: Mood is anxious and depressed. Affect is tearful.        Speech: Speech normal.        Behavior: Behavior normal. Behavior is cooperative.        Thought Content: Thought content normal.        Cognition and Memory: Cognition and memory normal.  Judgment: Judgment is impulsive.     Blood pressure 111/60, pulse 77, temperature (!) 97.5 F (36.4 C), temperature source Oral, resp. rate 15, height 5' 9 (1.753 m), weight 68.9 kg, last menstrual period 01/27/2012, SpO2 96%. Body mass index is 22.45 kg/m.  ASSESSMENT: Brenda Martinez is a 58 year old female with past psychiatric history significant for major depressive disorder and generalized anxiety disorder presenting for 6-8 weeks of worsening depression and anxiety symptoms that have been unrelieved by her outpatient regimen including ativan 0.5 mg TID, buspirone 10 BID, duloxetine 30 mg daily, mirtazapine 45 mg, and esketamine nasal spray. She presented to [facility] ED on 03/21/2024 in an acutely anxious state. She was admitted for acute management of her depression, anxiety, and passive suicidal ideation.   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  Distinguishing between the degree of generalized anxiety disorder versus major depressive disorder is challenging however this patient does have a few factors that point more towards the generalized anxiety picture than the major depressive disorder being the primary driving factor in her distress.    Of note the patient describes symptoms of neurological symptoms including a tightness and tingling in her pelvis reminiscent of the muscle tension frequently present in anxiety disorders.   Unspecified neurologic symptoms are more common in a GAD presentation than an MDD case.  The patient's depression has also responded more rapidly per patient reporting to pharmacological intervention than her has her anxiety, despite fairly generous dosing of as needed medications for anxiety.  The patient remains distressed but not depressed.  However the duration and severity of her depression symptoms do qualify her for an independent diagnosis of major depressive disorder, recurrent severe without psychotic features.  After looking at the possibilities for augmentation to address the generalized anxiety on top of the benzodiazepine, the antihistamine, and the SNRI, our best option is probably a second generation antipsychotic.  Also considered Wellbutrin, as the patient has tolerated it well for a very long time, but with her insomnia and the noradrenergic effects of the venlafaxine, another agent is preferable at this time.  Discussed Seroquel with the team as it has good evidence for its use in anxiety disorders.  We will start her on a moderate dose of aripiprazole as an adjunct to both anxiety and depression to see if we can make a rapid change in her anxiety levels.  Diagnoses / Active Problems: #Generalized anxiety disorder, without panic attacks # Major depressive disorder, without psychotic features  PLAN: Safety and Monitoring:  --  Voluntary admission to inpatient psychiatric unit for safety, stabilization and treatment  -- Daily contact with patient to assess and evaluate symptoms and progress in treatment  -- Patient's case to be discussed in multi-disciplinary team meeting  -- Observation Level : q15 minute checks  -- Vital signs:  q12 hours  -- Precautions: suicide, elopement, and assault  2. Psychiatric Diagnoses and Treatment:   - Continue trazodone 100 mg, for sleep - Continue ativan 1 mg TID, for acute anxiety - Continue venlafaxine 75 mg, primary antidepressant and  anxiolytic - Increase hydroxyzine 25 mg to 50 mg TID PRN adjunct anxiolytic - Start Abilify 5 mg as adjunct to anxiety and depression treatment once daily to assess for tolerability  --  The risks/benefits/side-effects/alternatives to this medication were discussed in detail with the patient and time was given for questions. The patient  consents to medication trial.   -- Metabolic profile and EKG monitoring obtained while on an atypical antipsychotic - Body mass index is 22.45 kg/m.,  - Lipid Panel: Mildly elevated LDL, 116 - Hbg A1c: Will obtain if patient remains on second gen antipsychotic - QTc: 417 Brenda, last obtained 03/21/2024  -- Encouraged patient to participate in unit milieu and in scheduled group therapies   -- Short Term Goals: Ability to identify changes in lifestyle to reduce recurrence of condition will improve, Ability to verbalize feelings will improve, Ability to demonstrate self-control will improve, Ability to identify and develop effective coping behaviors will improve, and Ability to identify triggers associated with substance abuse/mental health issues will improve  -- Long Term Goals: Improvement in symptoms so as ready for discharge    3. Medical Issues Being Addressed:    Labs reviewed, notable for mild ketosis and hazy appearance to urine, otherwise unremarkable   Tobacco Use Disorder  --  Patient does not need nicotine replacement  -- Smoking cessation encouraged  4. Discharge Planning:   -- Social work and case management to assist with discharge planning and identification of hospital follow-up needs prior to discharge  -- Estimated LOS: 5-7 days  -- Discharge Concerns: Need to establish a safety plan; Medication compliance and effectiveness  -- Discharge Goals: Return home with outpatient referrals for mental health follow-up including medication management/psychotherapy   Lundy Salisbury, MD 03/23/2024, 5:14 PM

## 2024-03-23 NOTE — BH IP Treatment Plan (Signed)
 Interdisciplinary Treatment and Diagnostic Plan Update  03/23/2024 Time of Session: 10:15 AM Brenda Martinez MRN: [id number]  Principal Diagnosis: GAD (generalized anxiety disorder)  Secondary Diagnoses: Principal Problem:   GAD (generalized anxiety disorder)   Current Medications:  Current Facility-Administered Medications  Medication Dose Route Frequency Provider Last Rate Last Admin   acetaminophen (TYLENOL) tablet 650 mg  650 mg Oral Q6H PRN Roise Cleaver, NP       alum & mag hydroxide-simeth (MAALOX/MYLANTA) 200-200-20 MG/5ML suspension 30 mL  30 mL Oral Q4H PRN Roise Cleaver, NP       feeding supplement (ENSURE PLUS HIGH PROTEIN) liquid 237 mL  237 mL Oral BID BM Ntuen, Tina C, FNP   237 mL at 03/23/24 1453   hydrOXYzine (ATARAX) tablet 25 mg  25 mg Oral TID PRN Roise Cleaver, NP   25 mg at 03/23/24 0722   LORazepam (ATIVAN) tablet 1 mg  1 mg Oral Q6H PRN Roise Cleaver, NP   1 mg at 03/23/24 1248   magnesium hydroxide (MILK OF MAGNESIA) suspension 30 mL  30 mL Oral Daily PRN Roise Cleaver, NP       OLANZapine (ZYPREXA) injection 10 mg  10 mg Intramuscular TID PRN Roise Cleaver, NP       OLANZapine (ZYPREXA) injection 5 mg  5 mg Intramuscular TID PRN Roise Cleaver, NP       OLANZapine zydis (ZYPREXA) disintegrating tablet 5 mg  5 mg Oral TID PRN Roise Cleaver, NP       traZODone (DESYREL) tablet 100 mg  100 mg Oral QHS PRN Lundy Salisbury, MD   100 mg at 03/22/24 2053   venlafaxine XR (EFFEXOR-XR) 24 hr capsule 75 mg  75 mg Oral Q breakfast Lundy Salisbury, MD   75 mg at 03/23/24 4098   PTA Medications: Medications Prior to Admission  Medication Sig Dispense Refill Last Dose/Taking   LORazepam (ATIVAN) 0.5 MG tablet Take 1 tablet by mouth 3 (three) times daily as needed.   03/21/2024   rosuvastatin (CRESTOR) 5 MG tablet Take 5 mg by mouth daily.   Past Month   busPIRone (BUSPAR) 10 MG tablet Take 1 tablet by mouth 2 (two)  times daily. (Patient not taking: Reported on 03/22/2024)   Not Taking   DULoxetine (CYMBALTA) 30 MG capsule Take 30 mg by mouth daily. (Patient not taking: Reported on 03/22/2024)   Not Taking   Esketamine HCl, 84 MG Dose, (SPRAVATO, 84 MG DOSE,) 28 MG/DEVICE SOPK Place 84 mg into the nose 2 (two) times a week. (Patient not taking: Reported on 03/22/2024)   Not Taking   mirtazapine (REMERON) 45 MG tablet Take 45 mg by mouth at bedtime. (Patient not taking: Reported on 03/22/2024)   Not Taking    Patient Stressors: Health problems   Occupational concerns    Patient Strengths: Ability for insight  Capable of independent living  Communication skills  General fund of knowledge  Supportive family/friends   Treatment Modalities: Medication Management, Group therapy, Case management,  1 to 1 session with clinician, Psychoeducation, Recreational therapy.   Physician Treatment Plan for Primary Diagnosis: GAD (generalized anxiety disorder) Long Term Goal(s): Improvement in symptoms so as ready for discharge   Short Term Goals: Ability to identify changes in lifestyle to reduce recurrence of condition will improve Ability to verbalize feelings will improve Ability to identify triggers associated with substance abuse/mental health issues will improve Ability to identify and develop effective coping behaviors will improve  Medication Management:  Evaluate patient's response, side effects, and tolerance of medication regimen.  Therapeutic Interventions: 1 to 1 sessions, Unit Group sessions and Medication administration.  Evaluation of Outcomes: Not Progressing  Physician Treatment Plan for Secondary Diagnosis: Principal Problem:   GAD (generalized anxiety disorder)  Long Term Goal(s): Improvement in symptoms so as ready for discharge   Short Term Goals: Ability to identify changes in lifestyle to reduce recurrence of condition will improve Ability to verbalize feelings will improve Ability to  identify triggers associated with substance abuse/mental health issues will improve Ability to identify and develop effective coping behaviors will improve     Medication Management: Evaluate patient's response, side effects, and tolerance of medication regimen.  Therapeutic Interventions: 1 to 1 sessions, Unit Group sessions and Medication administration.  Evaluation of Outcomes: Not Progressing   RN Treatment Plan for Primary Diagnosis: GAD (generalized anxiety disorder) Long Term Goal(s): Knowledge of disease and therapeutic regimen to maintain health will improve  Short Term Goals: Ability to remain free from injury will improve, Ability to verbalize frustration and anger appropriately will improve, Ability to demonstrate self-control, Ability to participate in decision making will improve, Ability to verbalize feelings will improve, Ability to disclose and discuss suicidal ideas, Ability to identify and develop effective coping behaviors will improve, and Compliance with prescribed medications will improve  Medication Management: RN will administer medications as ordered by provider, will assess and evaluate patient's response and provide education to patient for prescribed medication. RN will report any adverse and/or side effects to prescribing provider.  Therapeutic Interventions: 1 on 1 counseling sessions, Psychoeducation, Medication administration, Evaluate responses to treatment, Monitor vital signs and CBGs as ordered, Perform/monitor CIWA, COWS, AIMS and Fall Risk screenings as ordered, Perform wound care treatments as ordered.  Evaluation of Outcomes: Not Progressing   LCSW Treatment Plan for Primary Diagnosis: GAD (generalized anxiety disorder) Long Term Goal(s): Safe transition to appropriate next level of care at discharge, Engage patient in therapeutic group addressing interpersonal concerns.  Short Term Goals: Engage patient in aftercare planning with referrals and  resources, Increase social support, Increase ability to appropriately verbalize feelings, Increase emotional regulation, Facilitate acceptance of mental health diagnosis and concerns, Facilitate patient progression through stages of change regarding substance use diagnoses and concerns, Identify triggers associated with mental health/substance abuse issues, and Increase skills for wellness and recovery  Therapeutic Interventions: Assess for all discharge needs, 1 to 1 time with Social worker, Explore available resources and support systems, Assess for adequacy in community support network, Educate family and significant other(s) on suicide prevention, Complete Psychosocial Assessment, Interpersonal group therapy.  Evaluation of Outcomes: Not Progressing   Progress in Treatment: Attending groups: Yes. Participating in groups: Yes. Taking medication as prescribed: Yes. Toleration medication: Yes. Family/Significant other contact made: consents are pending Patient understands diagnosis: Yes. Discussing patient identified problems/goals with staff: Yes. Medical problems stabilized or resolved: Yes. Denies suicidal/homicidal ideation: Yes. Issues/concerns per patient self-inventory: No.  New problem(s) identified:  No  New Short Term/Long Term Goal(s):    medication stabilization, elimination of SI thoughts, development of comprehensive mental wellness plan.    Patient Goals:  I need to find medication that works for me.      Discharge Plan or Barriers:  Patient recently admitted. CSW will continue to follow and assess for appropriate referrals and possible discharge planning.   Reason for Continuation of Hospitalization: Anxiety Depression Medication stabilization  Estimated Length of Stay:  5 - 7 days  Last 3 [place] Suicide Severity Risk  Score: Flowsheet Row [facility] (Current[facility] Most recent reading at 03/21/2024  6:00  PM ED from 03/21/2024 in [facility] Emergency Department [facility] Most recent reading at 03/21/2024 10:18 AM  C-SSRS RISK CATEGORY No Risk No Risk       Last PHQ 2/9 Scores:     No data to display          Scribe for Treatment Team: Brittinee Risk O Caitrin Pendergraph, LCSWA 03/23/2024 5:52 PM

## 2024-03-23 NOTE — Plan of Care (Signed)
   Problem: Education: Goal: Emotional status will improve Outcome: Progressing Goal: Mental status will improve Outcome: Progressing Goal: Verbalization of understanding the information provided will improve Outcome: Progressing

## 2024-03-23 NOTE — [facility] Group Notes (Signed)
 [facility] Group Notes:  (Nursing/MHT/Case Management/Adjunct)  Date:  03/23/2024  Time:  8:36 PM  Type of Therapy:  NA Group  Participation Level:  Active  Participation Quality:  Appropriate  Affect:  Appropriate  Cognitive:  Appropriate  Insight:  Appropriate  Engagement in Group:  Engaged  Modes of Intervention:  Education  Summary of Progress/Problems:Attended NA meeting.  Brenda Martinez 03/23/2024, 8:36 PM

## 2024-03-24 LAB — GLUCOSE, CAPILLARY: Glucose-Capillary: 106 mg/dL — ABNORMAL HIGH (ref 70–99)

## 2024-03-24 MED ORDER — VENLAFAXINE HCL ER 75 MG PO CP24
112.5000 mg | ORAL_CAPSULE | Freq: Every day | ORAL | Status: DC
  Administered 2024-03-25 – 2024-03-26 (×2): 112.5 mg via ORAL
  Filled 2024-03-24 (×2): qty 1

## 2024-03-24 MED ORDER — ARIPIPRAZOLE 5 MG PO TABS
5.0000 mg | ORAL_TABLET | Freq: Every day | ORAL | Status: DC
  Administered 2024-03-24 – 2024-03-26 (×3): 5 mg via ORAL
  Filled 2024-03-24 (×3): qty 1

## 2024-03-24 NOTE — Plan of Care (Signed)

## 2024-03-24 NOTE — Group Note (Signed)
 LCSW Group Therapy Note   Group Date: 03/24/2024 Start Time: 1100 End Time: 1200   Participation:  patient was present  Type of Therapy:  Group Therapy   Topic:  Stronger Together:  Building Healthy Relationships  Objective:  To explore loneliness, boundaries, and safe ways to build relationships.  Goals: Recognize healthy vs. unhealthy relationships. Learn safe ways to connect with others. Strengthen communication and [employer].  Summary:  Participants discussed loneliness, healthy connections, and setting boundaries. They explored safe ways to meet people and shared personal experiences. Key insights were reinforced through discussion and quotes.  Therapeutic Modalities Used: Cognitive Behavioral Therapy (CBT) Elements - Identifying unhealthy relationship patterns, challenging negative thoughts about connection. Dialectical Behavior Therapy (DBT) Elements - Interpersonal effectiveness, setting and maintaining boundaries. Supportive Group Therapy - Peer discussion, shared experiences, and emotional validation.   Lizza Huffaker O Greycen Felter, LCSWA 03/24/2024  4:54 PM

## 2024-03-24 NOTE — Group Note (Signed)
 Therapy Group Note  Group Topic:Other  Group Date: 03/24/2024 Start Time: 1400 End Time: 1430 Facilitators: Takila Kronberg G, OT    The primary objective of this topic is to explore and understand the concept of occupational balance in the context of daily living. The term occupational balance is defined broadly, encompassing all activities that occupy an individual's time and energy, including self-care, leisure, and work-related tasks. The goal is to guide participants towards achieving a harmonious blend of these activities, tailored to their personal values and life circumstances. This balance is aimed at enhancing overall well-being, not by equally distributing time across activities, but by ensuring that daily engagements are fulfilling and not draining. The content delves into identifying various barriers that individuals face in achieving occupational balance, such as overcommitment, misaligned priorities, external pressures, and lack of effective time management. The impact of these barriers on occupational performance, roles, and lifestyles is examined, highlighting issues like reduced efficiency, strained relationships, and potential health problems. Strategies for cultivating occupational balance are a key focus. These strategies include practical methods like time blocking, prioritizing tasks, establishing self-care rituals, decluttering, connecting with nature, and engaging in reflective practices. These approaches are designed to be adaptable and applicable to a wide range of life scenarios, promoting a proactive and mindful approach to daily living. The overall aim is to equip participants with the knowledge and tools to create a balanced lifestyle that supports their mental, emotional, and physical health, thereby improving their functional performance in daily life.     Participation Level: Engaged   Participation Quality: Independent   Behavior: Appropriate   Speech/Thought  Process: Relevant   Affect/Mood: Appropriate   Insight: Fair   Judgement: Fair      Modes of Intervention: Education  Patient Response to Interventions:  Attentive   Plan: Continue to engage patient in OT groups 2 - 3x/week.  03/24/2024  Lynnda Sas, OT  Keiva Dina, OT

## 2024-03-24 NOTE — [facility] Counselor (Signed)
 Adult Comprehensive Assessment  Patient ID: Brenda Martinez, female   DOB: 1965/12/15, 58 y.o.   MRN: [id number]  Information Source: Information source: Patient  Current Stressors:  Patient states their primary concerns and needs for treatment are:: Improve symptoms associated with dibilitating anxiety Patient states their goals for this hospitilization and ongoing recovery are:: I need to find medication that works for me. Educational / Learning stressors: Denies Employment / Job issues: Unexpected / unwanted career transition causing stress Family Relationships: Aging mother with declining health currently in rehab with two recent strokes Financial / Lack of resources (include bankruptcy): Denies Housing / Lack of housing: Denies Physical health (include injuries & life threatening diseases): Denies Social relationships: Denies Substance abuse: Denies Bereavement / Loss: Denies  Living/Environment/Situation:  Living Arrangements: Children, Spouse/significant other Living conditions (as described by patient or guardian): WNL Who else lives in the home?: Husband, adut son age 59 How long has patient lived in current situation?: 30+ years  Family History:  Marital status: Married Number of Years Married: 35 What types of issues is patient dealing with in the relationship?: None Additional relationship information: Healthy & supportive relationship Are you sexually active?: Yes What is your sexual orientation?: DNA Has your sexual activity been affected by drugs, alcohol, medication, or emotional stress?: Denies Does patient have children?: Yes How many children?: 2 How is patient's relationship with their children?: Adult children ages 33 and 72; 19 yo. has special needs and she and spouse are co-legal guardians  Childhood History:  By whom was/is the patient raised?: Both parents Additional childhood history information: WNL; I had a wonderful childhood Description of  patient's relationship with caregiver when they were a child: Healthy Patient's description of current relationship with people who raised him/her: Father expired 7 years ago; relationship with mother as healthy How were you disciplined when you got in trouble as a child/adolescent?: WNL Does patient have siblings?: Yes Number of Siblings: 2 Description of patient's current relationship with siblings: close Did patient suffer any verbal/emotional/physical/sexual abuse as a child?: No Did patient suffer from severe childhood neglect?: No Has patient ever been sexually abused/assaulted/raped as an adolescent or adult?: No Was the patient ever a victim of a crime or a disaster?: No Witnessed domestic violence?: No Has patient been affected by domestic violence as an adult?: No  Education:  Highest grade of school patient has completed: Engineer, maintenance (IT) Currently a student?: No Learning disability?: No  Employment/Work Situation:   Employment Situation: Unemployed (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?: No  Financial Resources:   Financial resources: Income from employment, Income from spouse, Private insurance  Alcohol/Substance Abuse:   What has been your use of drugs/alcohol within the last 12 months?: WNL, <1 glass of wine per week If attempted suicide, did drugs/alcohol play a role in this?: No (n/a) Alcohol/Substance Abuse Treatment Hx: Denies past history If yes, describe treatment: n/a Has alcohol/substance  abuse ever caused legal problems?: No (n/a)  Social Support System:   Patient's Community Support System:  Good Describe Community Support System: Described family as very supportive Type of faith/religion: Catholic How does patient's faith help to cope with current illness?: praying  Leisure/Recreation:   Do You Have Hobbies?: Yes Leisure and Hobbies: Walking, reading, spending time wiht family  Strengths/Needs:   What is the patient's perception of their strengths?: Empathetic Patient states they can use these personal strengths during their treatment to contribute to their recovery: DNA Patient states these barriers may affect/interfere with their treatment: Denies Patient states these barriers may affect their return to the community: Denies Other important information patient would like considered in planning for their treatment: States wanting to have necessary support if there are medication concerns immediately post discharge  Discharge Plan:   Currently receiving community mental health services: Yes (From Whom) (Dr. Deborra Falter at [facility]) Patient states concerns and preferences for aftercare planning are: New psychiatry referral, interested in information for outpatient therapist so that she may choose her own Patient states they will know when they are safe and ready for discharge when: When this feeling goes away - regarding the pit in her stomach Does patient have access to transportation?: Yes Does patient have financial barriers related to discharge medications?: No Patient description of barriers related to discharge medications: denies Will patient be returning to same living situation after discharge?: Yes  Summary/Recommendations:   Summary and Recommendations (to be completed by the evaluator): Brenda Martinez is a 58 yo. female admitted to [facility] for worsening mood and increase in anxiety. Brenda Martinez has no prior history of inpatient treatment however  she does have history of mental health diagnosis to include Major Depressive Disorder and Generalized Anxiety Disorder. Changes in mood began in August with continued decline in the fall. Pt endorsed seeking support from her outpatient psychiatric provider followed by medication adjustments. After medication adjustments with no success and concerns for her safety, Adelle shared with her spouse that she felt higher level of care is needed. Pt was somewhat anxious during assessment, however she was a healthy historian and makes efforts to engage on the unit despite continued reports of anxiety. Allesha signed consent for Spouse to be reached for purposes of care coordination and discharge planning. Zenya is interested in becoming established with a new psychiatrist and interested in starting therapy. She would like available resources so that she is able to choose her preferred clinican.While here, Camira can benefit from crisis stabilization, medication management, therapeutic milieu, and referrals for services.   Devyn Griffing N Lasandra Batley, LCSW. 03/24/2024

## 2024-03-24 NOTE — [facility] Group Notes (Signed)
 [facility] Group Notes:  (Nursing/MHT/Case Management/Adjunct)  Date:  03/24/2024  Time:  9:23 PM  Type of Therapy:  Wrap-up group  Participation Level:  Active  Participation Quality:  Appropriate  Affect:  Appropriate  Cognitive:  Appropriate  Insight:  Appropriate  Engagement in Group:  Engaged  Modes of Intervention:  Education  Summary of Progress/Problems: Goal to be D/C met. Rated day 6/10.  Brenda Martinez 03/24/2024, 9:23 PM

## 2024-03-24 NOTE — Progress Notes (Signed)
 [facility] MD Progress Note 03/24/2024 8:18 AM EMMIE FRAKES  MRN:  [id number] Principal Problem: GAD (generalized anxiety disorder) Diagnosis: Principal Problem:   GAD (generalized anxiety disorder)   Subjective:   Ms ANNIYAH MOOD is a 58 year old female with past psychiatric history significant for major depressive disorder and generalized anxiety disorder presenting for 6-8 weeks of worsening depression and anxiety symptoms that have been unrelieved by her outpatient regimen including ativan 0.5 mg TID, buspirone 10 BID, duloxetine 30 mg daily, mirtazapine 45 mg, and esketamine nasal spray. She presented to the [facility] ED on 03/21/2024 in an acutely anxious state. She was admitted for acute management of her depression, anxiety, and passive suicidal ideation.   Case was discussed in the multidisciplinary team. MAR was reviewed and patient is compliant with medications.   Interval History: Psychiatric Team made the following recommendations yesterday:  - Increase Trazodone 100 mg,  - Increase ativan 1 mg TID,  - Start venlafaxine 75 mg,  - Start hydroxyzine 25 mg TID PRN   PRNs in last 24 hours: Med    Dose   Date Time(s)  Hydroxyzine  25 mg 6/10, 6/11 1207, 1818, 0722 Lorazepam   1 mg 6/10, 6/11 0754, 1548, 1248 Trazodone  100 mg 6/10 2053  Per nursing staff:  Patient remains active, social on the unit. Attending all groups. Quite frequently tearful when talking with other patients. Patient comes to window the minute her PRN is allowed.  Per Patient:  On assessment today, the patient reports good sleep, some improvements to depression, but rates her anxiety as somewhat improved.  She mentioned that she was extremely anxious about the idea of discharging on 6/13 before everyone felt that she was truly ready to go.  Told her that she could discharged safely on 6/14 without any issue.  She repeatedly asked for her psychiatrist to call her husband and explain why she would not be  discharging in time for the graduation.  Conversation documented as below.   Pt denies SI/HI/AVH/Paranoia/Delusions.  Patient cites no new side effects from medications.  Collateral information obtained Adah Hollering, phone number: , patient's ) Patient granted permission to speak to contact person without restrictions. Date of call: 6/12 Time of call: 1446 Number of call attempts: 1 Voicemail left: n/a Confirmed patient details via: Name, Birth date , and Relationship  Main Content:  Asked about prior to admission. In weeks before admission, patient was withdrawn, unable to initiate or participate in activities. Immediately prior to admission, she was so nervous she could not stand still. Able to, but difficult to participate in her conversations.   From his perspective, she seemed to get better after ketamine infusions, but admitted that she did seem even more anxious than before. He reported that the patient said she was doing much worse and had to go to the hospital.  Known medication allergies? none Is contact aware of statements from patient that indicate intent/plan to self harm? No. Never. Conflicts with catholic faith. Is contact aware of patient's adherence to prescribed medications? Excellent compliance  Collateral contact denies presence of firearms or large stockpiles of pills at home.  During this conversation, I explained in simple terms the patient's mental health condition, answered questions pertaining to the patient's current treatment and provided updates, outlined the treatment plan moving forward, provided guidance on safety planning (ie securing firearms, safe medication allocation, etc), coordinated plans for future disposition and recommended follow-up, and directed involved parties to available resources in the  event of patient decompensating.  03/24/2024 2:42 PM   Sleep: Good Appetite:  Fair Depression: Moderate Anxiety: High Auditory Hallucinations:  denied Visual Hallucinations: denied Paranoia: denied HI: denied SI: denied Side effects from medications: does not endorse any side-effects they attribute to medications. Other concerns discussed with patient: Discussed how much of her identity has been built around caring for and serving others and that some aspects of that identity have been threatened with her major life changes. Discussed the value of logotherapy and considering specific ways to begin to give her a why to work towards.  Review of Systems  Constitutional: Negative.   HENT: Negative.    Eyes: Negative.   Respiratory: Negative.    Cardiovascular: Negative.   Gastrointestinal: Negative.   Genitourinary: Negative.   Musculoskeletal: Negative.   Skin: Negative.   Neurological: Negative.   Endo/Heme/Allergies: Negative.   Psychiatric/Behavioral:  Positive for depression. Negative for hallucinations, memory loss, substance abuse and suicidal ideas. The patient is nervous/anxious. The patient does not have insomnia.     Total time spent with patient: 30 minutes  PPast Psychiatric Hx: Previous Psych Diagnoses: GAD, MDD Prior inpatient treatment: No  Current/prior outpatient treatment: Yes, Dr Deborra Falter Prior rehab hx: denies Psychotherapy hx: denies History of suicide: denies History of homicide or aggression: denies Psychiatric medication history: escitalopram, buproprion 150, vilazodone, sertraline, esketamine, buspirone, duloxetine, hydroxyzine, lorazepam Psychiatric medication compliance history: good Neuromodulation history: denies Current Psychiatrist:Phone: [phone] Fax [phone] Daphine Eagle, MD Current therapist: na   Substance Abuse Hx: Alcohol: drinks <1 glass of wine per week Tobacco: never Illicit drugs: never Rx drug abuse: denies, PDMP review conducted 03/22/2024 shows Average Risk. One prescriber.  Rehab hx: no   Past Medical History: Medical Diagnoses: HTN Home Rx: rosuvastatin   Prior Hosp: Prior Surgeries/Trauma: 2x c-sections, abdominal hernia repair Head trauma, LOC, concussions, seizures: Denies Allergies: Denies LMP: postmenopausal PCP: Rayma Calandra, [phone]   Family History: Medical: Mother has had 3x strokes, father passed away. Psych: Sister with severe anxiety. Maternal grandmother had bipolar disorder Psych Rx: Duloxetine 30 mg for sister seems to help SA/HA: denies Substance use family hx: denies   Social History: Childhood (bring, raised, lives now, parents, siblings, schooling, education): lives in [place], originally from [place] . Abuse: denies Marital Status: married 35 years.  Sexual orientation: straight Children: 2x sons. 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 No pertinent surgical history.  Social History:  Social History   Substance and Sexual Activity  Alcohol Use No     Social History   Substance and Sexual Activity  Drug Use No    Social History   Socioeconomic History   Marital status: Married    Spouse name: Not on file   Number of children: Not on file   Years of education: Not on file   Highest education level: Not on file  Occupational History   Not on file  Tobacco Use   Smoking status: Never   Smokeless tobacco: Never  Vaping Use   Vaping status:  Never Used  Substance and Sexual Activity   Alcohol use: No   Drug use: No   Sexual activity: Yes    Birth control/protection: Pill  Other Topics Concern   Not on file  Social History Narrative   Not on  file   Social Drivers of Health   Financial Resource Strain: Not on file  Food Insecurity: No Food Insecurity (03/21/2024)   Hunger Vital Sign    Worried About Running Out of Food in the Last Year: Never true    Ran Out of Food in the Last Year: Never true  Transportation Needs: No Transportation Needs (03/21/2024)   PRAPARE - Administrator, Civil Service (Medical): No    Lack of Transportation (Non-Medical): No  Physical Activity: Not on file  Stress: Not on file  Social Connections: Not on file   Additional Social History:   Objective: Medications, Labs, Mental Status Exam, Physical Exam Current Medications: Current Facility-Administered Medications  Medication Dose Route Frequency Provider Last Rate Last Admin   acetaminophen (TYLENOL) tablet 650 mg  650 mg Oral Q6H PRN Roise Cleaver, NP       alum & mag hydroxide-simeth (MAALOX/MYLANTA) 200-200-20 MG/5ML suspension 30 mL  30 mL Oral Q4H PRN Roise Cleaver, NP       feeding supplement (ENSURE PLUS HIGH PROTEIN) liquid 237 mL  237 mL Oral BID BM Ntuen, Tina C, FNP   237 mL at 03/23/24 1453   hydrOXYzine (ATARAX) tablet 50 mg  50 mg Oral TID PRN Lundy Salisbury, MD   50 mg at 03/23/24 2110   LORazepam (ATIVAN) tablet 1 mg  1 mg Oral Q6H PRN Roise Cleaver, NP   1 mg at 03/23/24 1248   magnesium hydroxide (MILK OF MAGNESIA) suspension 30 mL  30 mL Oral Daily PRN Roise Cleaver, NP       OLANZapine (ZYPREXA) injection 10 mg  10 mg Intramuscular TID PRN Roise Cleaver, NP       OLANZapine (ZYPREXA) injection 5 mg  5 mg Intramuscular TID PRN Roise Cleaver, NP       OLANZapine zydis (ZYPREXA) disintegrating tablet 5 mg  5 mg Oral TID PRN Roise Cleaver, NP       traZODone (DESYREL) tablet 100 mg  100 mg Oral QHS PRN Lundy Salisbury, MD   100 mg at 03/23/24 2110   venlafaxine XR (EFFEXOR-XR) 24 hr capsule 75 mg  75 mg Oral Q breakfast Lundy Salisbury, MD   75 mg at  03/24/24 7829    Lab Results:  Results for orders placed or performed during the hospital encounter of 03/21/24 (from the past 48 hours)  Glucose, capillary     Status: Abnormal   Collection Time: 03/24/24  5:55 AM  Result Value Ref Range   Glucose-Capillary 106 (H) 70 - 99 mg/dL    Comment: Glucose reference range applies only to samples taken after fasting for at least 8 hours.    Blood Alcohol level:  No results found for: [facility]  Metabolic Disorder Labs: No results found for: HGBA1C, MPG No results found for: PROLACTIN Lab Results  Component Value Date   CHOL 199 03/21/2024   TRIG 50 03/21/2024   HDL 73 03/21/2024   CHOLHDL 2.7 03/21/2024   VLDL 10 03/21/2024   LDLCALC 116 (H) 03/21/2024    Physical Findings: AIMS:  , ,  ,  ,    CIWA:    COWS:     Musculoskeletal: Strength &  Muscle Tone: within normal limits Gait & Station: normal Patient leans: N/A  Mental Status Exam: General Appearance: Casual  Orientation:  Full (Time, Place, and Person)  Memory:  Immediate;   Good Recent;   Good Remote;   Good  Concentration:  Concentration: Good  Recall:  Good  Attention  Good  Eye Contact:  Good  Speech:  Clear and Coherent and Normal Rate  Language:  Good  Volume:  Normal  Mood: Somewhat better  Affect:  Appropriate, Congruent, and first interview without any tears  Thought Process:  Coherent  Thought Content:  NA  Suicidal Thoughts:  No  Homicidal Thoughts:  No  Judgement:  Good  Insight:  Good  Psychomotor Activity:  Normal  Akathisia:  No  Fund of Knowledge:  Good      Assets:  Communication Skills Desire for Improvement Financial Resources/Insurance Housing Intimacy Physical Health Social Support Talents/Skills Vocational/Educational  Cognition:  WNL  ADL's:  Impaired   Physical Exam: Physical Exam Vitals and nursing note reviewed.  HENT:     Head: Normocephalic and atraumatic.  Pulmonary:     Effort: Pulmonary effort is normal.    Skin:    General: Skin is warm and dry.     Capillary Refill: Capillary refill takes less than 2 seconds.   Neurological:     General: No focal deficit present.     Mental Status: She is alert and oriented to person, place, and time.   Psychiatric:        Attention and Perception: Attention and perception normal.        Mood and Affect: Mood is anxious and depressed. Affect is tearful.        Speech: Speech normal.        Behavior: Behavior normal. Behavior is cooperative.        Thought Content: Thought content normal.        Cognition and Memory: Cognition and memory normal.        Judgment: Judgment is impulsive.     Blood pressure 105/63, pulse 79, temperature 98.2 F (36.8 C), temperature source Oral, resp. rate 16, height 5' 9 (1.753 m), weight 68.9 kg, last menstrual period 01/27/2012, SpO2 97%. Body mass index is 22.45 kg/m.  ASSESSMENT: Ms AADVIKA KONEN is a 58 year old female with past psychiatric history significant for major depressive disorder and generalized anxiety disorder presenting for 6-8 weeks of worsening depression and anxiety symptoms that have been unrelieved by her outpatient regimen including ativan 0.5 mg TID, buspirone 10 BID, duloxetine 30 mg daily, mirtazapine 45 mg, and esketamine nasal spray. She presented to the [facility] ED on 03/21/2024 in an acutely anxious state. She was admitted for acute management of her depression, anxiety, and passive suicidal ideation.   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  Distinguishing between the degree of generalized anxiety disorder versus major depressive disorder is challenging however this patient does have a few factors that point more towards the generalized anxiety picture than the major depressive disorder being the primary driving factor in her distress.    Of note the  patient describes symptoms of neurological symptoms including a tightness and tingling in her pelvis reminiscent of the muscle tension frequently present in anxiety disorders.  Unspecified neurologic symptoms are more common in a GAD presentation than an MDD case.  The patient's depression has also responded more rapidly per  patient reporting to pharmacological intervention than her has her anxiety, despite fairly generous dosing of as needed medications for anxiety.  The patient remains distressed but not depressed.  However the duration and severity of her depression symptoms do qualify her for an independent diagnosis of major depressive disorder, recurrent severe without psychotic features.  After looking at the possibilities for augmentation to address the generalized anxiety on top of the benzodiazepine, the antihistamine, and the SNRI, our best option is probably a second generation antipsychotic.  Also considered Wellbutrin, as the patient has tolerated it well for a very long time, but with her insomnia and the noradrenergic effects of the venlafaxine, another agent is preferable at this time.  Discussed Seroquel with the team as it has good evidence for its use in anxiety disorders.  We will start her on a moderate dose of aripiprazole as an adjunct to both anxiety and depression to see if we can make a rapid change in her anxiety levels.  6/12: Patient somewhat improved relative to yesterday.  Patient did not break out into tears at any point during the interview today which we both agreed with significant.  Able to relate her situational fears about the graduation and the immediate aftermath of her discharge from the hospital.  Discussed various types of therapy that may be beneficial to her anxiety symptoms, including cognitive behavioral therapy and possibly dialectical behavioral therapy.  Collateral call with husband produced information that was largely consistent with patient's reports.  Stressed the importance of therapy rather than merely relying on medications to fix problems.  More times the patient is seen on the unit by multiple providers the more there is some element of personality involved, patient does not have the really moody aggressive flares, and to borderline patients, but does seem to experience some of the anxiously high highs and low lows of a patient with a histrionic personality disorder.  Not definitive.  Diagnoses / Active Problems: #Generalized anxiety disorder, without panic attacks # Major depressive disorder, without psychotic features  PLAN: Safety and Monitoring:  --  Voluntary admission to inpatient psychiatric unit for safety, stabilization and treatment  -- Daily contact with patient to assess and evaluate symptoms and progress in treatment  -- Patient's case to be discussed in multi-disciplinary team meeting  -- Observation Level : q15 minute checks  -- Vital signs:  q12 hours  -- Precautions: suicide, elopement, and assault  2. Psychiatric Diagnoses and Treatment:   - Continue trazodone 100 mg, for sleep - Continue ativan 1 mg TID, for acute anxiety - Increase venlafaxine 75 mg to 112.5 mg primary antidepressant and anxiolytic (targeting 150) - Continue 50 mg TID PRN adjunct anxiolytic - Continue Abilify 5 mg as adjunct to anxiety and depression treatment once daily to assess for tolerability  --  The risks/benefits/side-effects/alternatives to this medication were discussed in detail with the patient and time was given for questions. The patient consents to medication trial.   -- Metabolic profile and EKG monitoring obtained while on an atypical antipsychotic - Body mass index is 22.45 kg/m.,  - Lipid Panel: Mildly elevated LDL, 116 - Hbg A1c: A1c ordered for 6/13 AM - QTc: 417 ms, last obtained 03/21/2024  -- Encouraged patient to participate in unit milieu and in scheduled group therapies   -- Short Term Goals: Ability to identify  changes in lifestyle to reduce recurrence of condition will improve, Ability to verbalize feelings will improve, Ability to demonstrate self-control will improve, Ability to identify and  develop effective coping behaviors will improve, and Ability to identify triggers associated with substance abuse/mental health issues will improve  -- Long Term Goals: Improvement in symptoms so as ready for discharge    3. Medical Issues Being Addressed:    Labs reviewed, notable for mild ketosis and hazy appearance to urine, otherwise unremarkable   Tobacco Use Disorder  --  Patient does not need nicotine replacement  -- Smoking cessation encouraged  4. Discharge Planning:   -- Social work and case management to assist with discharge planning and identification of hospital follow-up needs prior to discharge  -- Estimated LOS: 5-7 days  -- Discharge Concerns: Need to establish a safety plan; Medication compliance and effectiveness  -- Discharge Goals: Return home with outpatient referrals for mental health follow-up including medication management/psychotherapy   Lundy Salisbury, MD 03/24/2024, 8:18 AM

## 2024-03-24 NOTE — Progress Notes (Signed)
 D: Patient is alert, oriented, pleasant, and cooperative. Denies SI, HI, AVH, and verbally contracts for safety. Patient reports she slept fair last night with sleeping medication. Patient reports her appetite as poor, energy level as low, and concentration as better. Patient rates her depression 5/10, hopelessness 4/10, and anxiety 6/10. Patient denies physical symptoms/pain. Patient reports anxiety.    A: Scheduled medications administered per MD order. PRN hydroxyzine and ativan administered. Support provided. Patient educated on safety on the unit and medications. Routine safety checks every 15 minutes. Patient stated understanding to tell nurse about any new physical symptoms. Patient understands to tell staff of any needs.     R: No adverse drug reactions noted. Patient remains safe at this time and will continue to monitor.    03/24/24 1000  Psych Admission Type (Psych Patients Only)  Admission Status Voluntary  Psychosocial Assessment  Patient Complaints Anxiety;Depression  Eye Contact Fair  Facial Expression Anxious;Animated  Affect Anxious  Speech Logical/coherent  Interaction Assertive  Motor Activity Other (Comment) (WNL)  Appearance/Hygiene Unremarkable  Behavior Characteristics Cooperative;Appropriate to situation  Mood Anxious;Pleasant  Thought Process  Coherency WDL  Content WDL  Delusions None reported or observed  Perception WDL  Hallucination None reported or observed  Judgment Limited  Confusion Moderate  Danger to Self  Current suicidal ideation? Denies  Danger to Others  Danger to Others None reported or observed

## 2024-03-25 LAB — HEMOGLOBIN A1C
Hgb A1c MFr Bld: 5.4 % (ref 4.8–5.6)
Mean Plasma Glucose: 108.28 mg/dL

## 2024-03-25 NOTE — Progress Notes (Signed)
 [facility] MD Progress Note 03/25/2024 7:24 AM Brenda Martinez  MRN:  [id number] Principal Problem: GAD (generalized anxiety disorder) Diagnosis: Principal Problem:   GAD (generalized anxiety disorder)   Subjective:   Ms Brenda Martinez is a 58 year old female with past psychiatric history significant for major depressive disorder and generalized anxiety disorder presenting for 6-8 weeks of worsening depression and anxiety symptoms that have been unrelieved by her outpatient regimen including ativan  0.5 mg TID, buspirone 10 BID, duloxetine 30 mg daily, mirtazapine 45 mg, and esketamine nasal spray. She presented to the [facility] ED on 03/21/2024 in an acutely anxious state. She was admitted for acute management of her depression, anxiety, and passive suicidal ideation.   Case was discussed in the multidisciplinary team. MAR was reviewed and patient is compliant with medications.   Interval History: Psychiatric Team made the following recommendations yesterday:  - Continue trazodone  100 mg, for sleep - Continue ativan  1 mg TID, for acute anxiety - Increase venlafaxine  75 mg to 112.5 mg primary antidepressant and anxiolytic (targeting 150) - Continue 50 mg TID PRN adjunct anxiolytic - Continue Abilify  5 mg as adjunct to anxiety and depression treatment once daily to assess for tolerability  PRNs in last 24 hours: Med    Dose   Date Time(s)  Hydroxyzine   50 mg  6/12 1212, 2127 Lorazepam    1 mg  6/12 1450 Trazodone   100 mg 6/12 2127  Per nursing staff:  Patient remains active, social on the unit. Attending all groups. Quite frequently tearful when talking with other patients. Patient comes to window the minute her PRN is allowed.  Per Patient:  On assessment today, the patient reports good sleep, some improvements to depression, but rates her anxiety as somewhat improved. Patient reports continued abdominal tightness at the height of her anxiety spikes, though she notes that the spikes have been fewer  and less extreme. Further discussed possible types of therapy that would benefit her, stressed the value of multiple therapists for skills focus (e.g. CBT) and deeper work on core symptoms of anxious and outsized reactions to stress (DBT and psychodynamic).  Sleep: Good Appetite:  Fair Depression: Moderate Anxiety: High Auditory Hallucinations: denied Visual Hallucinations: denied Paranoia: denied HI: denied SI: denied Side effects from medications: does not endorse any side-effects they attribute to medications. Other concerns discussed with patient: Discussed her mixed feelings about missing her son's graduation.  Review of Systems  Constitutional: Negative.   HENT: Negative.    Eyes: Negative.   Respiratory: Negative.    Cardiovascular: Negative.   Gastrointestinal: Negative.   Genitourinary: Negative.   Musculoskeletal: Negative.   Skin: Negative.   Neurological: Negative.   Endo/Heme/Allergies: Negative.   Psychiatric/Behavioral:  Positive for depression. Negative for hallucinations, memory loss, substance abuse and suicidal ideas. The patient is nervous/anxious. The patient does not have insomnia.     Total time spent with patient: 30 minutes  PPast Psychiatric Hx: Previous Psych Diagnoses: GAD, MDD Prior inpatient treatment: No  Current/prior outpatient treatment: Yes, Dr Deborra Falter (would like another prescriber) Prior rehab hx: denies Psychotherapy hx: denies History of suicide: denies History of homicide or aggression: denies Psychiatric medication history: escitalopram, buproprion 150, vilazodone, sertraline, esketamine, buspirone, duloxetine, hydroxyzine , lorazepam  Psychiatric medication compliance history: good Neuromodulation history: denies Current Psychiatrist:Phone: [phone] Fax [phone] Daphine Eagle, MD Current therapist: na   Substance Abuse Hx: Alcohol: drinks <1 glass of wine per week Tobacco: never Illicit drugs: never Rx drug abuse:  denies, PDMP review conducted  03/22/2024 shows Average Risk. One prescriber.  Rehab hx: no   Past Medical History: Medical Diagnoses: HTN Home Rx: rosuvastatin  Prior Hosp: Prior Surgeries/Trauma: 2x c-sections, abdominal hernia repair Head trauma, LOC, concussions, seizures: Denies Allergies: Denies LMP: postmenopausal PCP: Rayma Calandra, [phone]   Family History: Medical: Mother has had 3x strokes, father passed away. Psych: Sister with severe anxiety. Maternal grandmother had bipolar disorder Psych Rx: Duloxetine 30 mg for sister seems to help SA/HA: denies Substance use family hx: denies   Social History: Childhood (bring, raised, lives now, parents, siblings, schooling, education): lives in [place], originally from [place] . Abuse: denies Marital Status: married 35 years.  Sexual orientation: straight Children: 2x sons. 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 No pertinent surgical history.  Social History:  Social History   Substance and Sexual Activity  Alcohol Use No     Social History   Substance and Sexual Activity  Drug Use No    Social History   Socioeconomic History   Marital status: Married    Spouse name: Not on file   Number of children: Not on file   Years of education: Not on file   Highest education level: Not on file  Occupational History   Not on file  Tobacco Use   Smoking status: Never   Smokeless tobacco: Never  Vaping Use   Vaping status: Never Used  Substance and Sexual Activity    Alcohol use: No   Drug use: No   Sexual activity: Yes    Birth control/protection: Pill  Other Topics Concern   Not on file  Social History Narrative   Not on file   Social Drivers of Health   Financial Resource Strain: Not on file  Food Insecurity: No Food Insecurity (03/21/2024)   Hunger Vital Sign    Worried About Running Out of Food in the Last Year: Never true    Ran Out of Food in the Last Year: Never true  Transportation Needs: No Transportation Needs (03/21/2024)   PRAPARE - Administrator, Civil Service (Medical): No    Lack of Transportation (Non-Medical): No  Physical Activity: Not on file  Stress: Not on file  Social Connections: Not on file   Additional Social History:   Objective: Medications, Labs, Mental Status Exam, Physical Exam Current Medications: Current Facility-Administered Medications  Medication Dose Route Frequency Provider Last Rate Last Admin   acetaminophen  (TYLENOL ) tablet 650 mg  650 mg Oral Q6H PRN Roise Cleaver, NP       alum & mag hydroxide-simeth (MAALOX/MYLANTA) 200-200-20 MG/5ML suspension 30 mL  30 mL Oral Q4H PRN Roise Cleaver, NP       ARIPiprazole  (ABILIFY ) tablet 5 mg  5 mg Oral Daily Lundy Salisbury, MD   5 mg at 03/24/24 1015   feeding supplement (ENSURE PLUS HIGH PROTEIN) liquid 237 mL  237 mL Oral BID BM Ntuen, Tina C, FNP   237 mL at 03/24/24 1015   hydrOXYzine  (ATARAX ) tablet 50 mg  50 mg  Oral TID PRN Lundy Salisbury, MD   50 mg at 03/24/24 2127   LORazepam  (ATIVAN ) tablet 1 mg  1 mg Oral Q6H PRN Roise Cleaver, NP   1 mg at 03/24/24 1450   magnesium  hydroxide (MILK OF MAGNESIA) suspension 30 mL  30 mL Oral Daily PRN Roise Cleaver, NP       OLANZapine  (ZYPREXA ) injection 10 mg  10 mg Intramuscular TID PRN Roise Cleaver, NP       OLANZapine  (ZYPREXA ) injection 5 mg  5 mg Intramuscular TID PRN Roise Cleaver, NP       OLANZapine  zydis (ZYPREXA ) disintegrating tablet 5 mg  5 mg  Oral TID PRN Roise Cleaver, NP       traZODone  (DESYREL ) tablet 100 mg  100 mg Oral QHS PRN Lundy Salisbury, MD   100 mg at 03/24/24 2127   venlafaxine  XR (EFFEXOR -XR) 24 hr capsule 112.5 mg  112.5 mg Oral Q breakfast Lundy Salisbury, MD        Lab Results:  Results for orders placed or performed during the hospital encounter of 03/21/24 (from the past 48 hours)  Glucose, capillary     Status: Abnormal   Collection Time: 03/24/24  5:55 AM  Result Value Ref Range   Glucose-Capillary 106 (H) 70 - 99 mg/dL    Comment: Glucose reference range applies only to samples taken after fasting for at least 8 hours.    Blood Alcohol level:  No results found for: [facility]  Metabolic Disorder Labs: No results found for: HGBA1C, MPG No results found for: PROLACTIN Lab Results  Component Value Date   CHOL 199 03/21/2024   TRIG 50 03/21/2024   HDL 73 03/21/2024   CHOLHDL 2.7 03/21/2024   VLDL 10 03/21/2024   LDLCALC 116 (H) 03/21/2024    Physical Findings: AIMS:  , ,  ,  ,    CIWA:    COWS:     Musculoskeletal: Strength & Muscle Tone: within normal limits Gait & Station: normal Patient leans: N/A  Mental Status Exam: General Appearance: Casual  Orientation:  Full (Time, Place, and Person)  Memory:  Immediate;   Good Recent;   Good Remote;   Good  Concentration:  Concentration: Good  Recall:  Good  Attention  Good  Eye Contact:  Good  Speech:  Clear and Coherent and Normal Rate  Language:  Good  Volume:  Normal  Mood: Somewhat better  Affect:  Appropriate, Congruent, and 2nd interview without any tears  Thought Process:  Coherent  Thought Content:  NA  Suicidal Thoughts:  No  Homicidal Thoughts:  No  Judgement:  Good  Insight:  Good  Psychomotor Activity:  Normal  Akathisia:  No  Fund of Knowledge:  Good      Assets:  Communication Skills Desire for Improvement Financial Resources/Insurance Housing Intimacy Physical  Health Social Support Talents/Skills Vocational/Educational  Cognition:  WNL  ADL's:  Impaired   Physical Exam: Physical Exam Vitals and nursing note reviewed.  HENT:     Head: Normocephalic and atraumatic.  Pulmonary:     Effort: Pulmonary effort is normal.   Skin:    General: Skin is warm and dry.     Capillary Refill: Capillary refill takes less than 2 seconds.   Neurological:     General: No focal deficit present.     Mental Status: She is alert and oriented to person, place, and time.   Psychiatric:  Attention and Perception: Attention and perception normal.        Mood and Affect: Mood is anxious and depressed. Affect is tearful.        Speech: Speech normal.        Behavior: Behavior normal. Behavior is cooperative.        Thought Content: Thought content normal.        Cognition and Memory: Cognition and memory normal.        Judgment: Judgment is impulsive.     Blood pressure 113/64, pulse 86, temperature 98.2 F (36.8 C), temperature source Oral, resp. rate 16, height 5' 9 (1.753 m), weight 68.9 kg, last menstrual period 01/27/2012, SpO2 96%. Body mass index is 22.45 kg/m.  ASSESSMENT: Ms Brenda Martinez is a 58 year old female with past psychiatric history significant for major depressive disorder and generalized anxiety disorder presenting for 6-8 weeks of worsening depression and anxiety symptoms that have been unrelieved by her outpatient regimen including ativan  0.5 mg TID, buspirone 10 BID, duloxetine 30 mg daily, mirtazapine 45 mg, and esketamine nasal spray. She presented to the [facility] ED on 03/21/2024 in an acutely anxious state. She was admitted for acute management of her depression, anxiety, and passive suicidal ideation.   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  Distinguishing between the degree of generalized  anxiety disorder versus major depressive disorder is challenging however this patient does have a few factors that point more towards the generalized anxiety picture than the major depressive disorder being the primary driving factor in her distress.    Of note the patient describes symptoms of neurological symptoms including a tightness and tingling in her pelvis reminiscent of the muscle tension frequently present in anxiety disorders.  Unspecified neurologic symptoms are more common in a GAD presentation than an MDD case.  The patient's depression has also responded more rapidly per patient reporting to pharmacological intervention than her has her anxiety, despite fairly generous dosing of as needed medications for anxiety.  The patient remains distressed but not depressed.  However the duration and severity of her depression symptoms do qualify her for an independent diagnosis of major depressive disorder, recurrent severe without psychotic features.  After looking at the possibilities for augmentation to address the generalized anxiety on top of the benzodiazepine, the antihistamine, and the SNRI, our best option is probably a second generation antipsychotic.  Also considered Wellbutrin, as the patient has tolerated it well for a very long time, but with her insomnia and the noradrenergic effects of the venlafaxine , another agent is preferable at this time.  Discussed Seroquel with the team as it has good evidence for its use in anxiety disorders.  We will start her on a moderate dose of aripiprazole  as an adjunct to both anxiety and depression to see if we can make a rapid change in her anxiety levels.  6/12: Patient somewhat improved relative to yesterday.  Patient did not break out into tears at any point during the interview today which we both agreed with significant.  Able to relate her situational fears about the graduation and the immediate aftermath of her discharge from the hospital.  Discussed  various types of therapy that may be beneficial to her anxiety symptoms, including cognitive behavioral therapy and possibly dialectical behavioral therapy.  Collateral call with husband produced information that was largely consistent with patient's reports. Stressed the importance of therapy  rather than merely relying on medications to fix problems.  More times the patient is seen on the unit by multiple providers the more there is some element of personality involved, patient does not have the really moody aggressive flares, and to borderline patients, but does seem to experience some of the anxiously high highs and low lows of a patient with a histrionic personality disorder.  Not definitive.  6/13: Patient still improving.  Patient is able to discuss a number of topics with humor, without tears, and showing reasonable insight into her situation.  Patient is clearly intelligent and would likely do well with a combination of psychotherapy and CBT.  She continued her practice with the deep breathing exercises taught earlier in her admission.  Reported some success.  She would also probably do well with neuromodulation or HeartMath techniques.  Diagnoses / Active Problems: #Generalized anxiety disorder, without panic attacks # Major depressive disorder, without psychotic features  PLAN: Safety and Monitoring:  --  Voluntary admission to inpatient psychiatric unit for safety, stabilization and treatment  -- Daily contact with patient to assess and evaluate symptoms and progress in treatment  -- Patient's case to be discussed in multi-disciplinary team meeting  -- Observation Level : q15 minute checks  -- Vital signs:  q12 hours  -- Precautions: suicide, elopement, and assault  2. Psychiatric Diagnoses and Treatment:   - Continue trazodone  100 mg, for sleep - Continue ativan  1 mg TID, for acute anxiety - Continue venlafaxine  112.5 mg primary antidepressant and anxiolytic (targeting 150) -  Continue 50 mg TID PRN adjunct anxiolytic - Continue Abilify  5 mg as adjunct to anxiety and depression treatment once daily to assess for tolerability  --  The risks/benefits/side-effects/alternatives to this medication were discussed in detail with the patient and time was given for questions. The patient consents to medication trial.   -- Metabolic profile and EKG monitoring obtained while on an atypical antipsychotic - Body mass index is 22.45 kg/m.,  - Lipid Panel: Mildly elevated LDL, 116 - Hbg A1c: A1c ordered for 6/13 AM - QTc: 417 ms, last obtained 03/21/2024  -- Encouraged patient to participate in unit milieu and in scheduled group therapies   -- Short Term Goals: Ability to identify changes in lifestyle to reduce recurrence of condition will improve, Ability to verbalize feelings will improve, Ability to demonstrate self-control will improve, Ability to identify and develop effective coping behaviors will improve, and Ability to identify triggers associated with substance abuse/mental health issues will improve  -- Long Term Goals: Improvement in symptoms so as ready for discharge    3. Medical Issues Being Addressed:    Labs reviewed, notable for mild ketosis and hazy appearance to urine, otherwise unremarkable   Tobacco Use Disorder  --  Patient does not need nicotine replacement  -- Smoking cessation encouraged  4. Discharge Planning:   -- Social work and case management to assist with discharge planning and identification of hospital follow-up needs prior to discharge  -- Estimated LOS: Estimated discharge 6/14  -- Discharge Concerns: Need to establish a safety plan; Medication compliance and effectiveness  -- Discharge Goals: Return home with outpatient referrals for mental health follow-up including medication management/psychotherapy   Lundy Salisbury, MD 03/25/2024, 7:24 AM

## 2024-03-25 NOTE — Care Management Note (Signed)
 This follow up request was left by the social worker on the handoff:  can you PLEASE add the fancy place that makes people schedule and pay out of pocket as resource, cant remember the name. Has the cute website .     There are several that fit this description but I cannot add them all and need to know the name of provider to add to f/u, even as just a resource.

## 2024-03-25 NOTE — Plan of Care (Signed)

## 2024-03-25 NOTE — Group Note (Signed)
 Recreation Therapy Group Note   Group Topic:Problem Solving  Group Date: 03/25/2024 Start Time: 0960 End Time: 1000 Facilitators: Rohil Lesch-McCall, LRT,CTRS Location: [address] Dayroom   Group Topic: Problem Solving  Goal Area(s) Addresses:  Patient will effectively work in a team with other group members. Patient will verbalize importance of using appropriate problem solving techniques.   Behavioral Response: Engaged  Intervention: Worksheet  Activity: Dentist. Patients were given two worksheets of brain teasers. Patients got 15 minutes to complete the puzzles. Patients could work with each other if they chose to to figure out what each puzzle was. At the end of the 15 minutes, LRT would go over the answers with the group.     Education: Journalist, newspaper, Communication, Team Building  Education Outcome: Acknowledges understanding/In group clarification offered/Needs additional education.    Affect/Mood: Appropriate   Participation Level: Engaged   Participation Quality: Independent   Behavior: Appropriate   Speech/Thought Process: Focused   Insight: Good   Judgement: Good   Modes of Intervention: Activity   Patient Response to Interventions:  Engaged   Education Outcome:  In group clarification offered    Clinical Observations/Individualized Feedback: Pt attended and participated in group session. Pt was vocal in offering suggestions to some of the puzzles presented on the sheet.      Plan: Continue to engage patient in RT group sessions 2-3x/week.   Kitt Minardi-McCall, LRT,CTRS  03/25/2024 12:45 PM

## 2024-03-25 NOTE — Progress Notes (Signed)
 D: Patient is alert, oriented, pleasant, and cooperative. Denies SI, HI, AVH, and verbally contracts for safety. Patient reports she slept fair last night with sleeping medication. Patient reports her energy level as normal and concentration as fair. Patient rates her depression 5/10, hopelessness 3/10, and anxiety 6/10. Patient denies physical symptoms/pain. Patient reports anxiety.    A: Scheduled medications administered per MD order. PRN hydroxyzine  and ativan  administered. Support provided. Patient educated on safety on the unit and medications. Routine safety checks every 15 minutes. Patient stated understanding to tell nurse about any new physical symptoms. Patient understands to tell staff of any needs.     R: No adverse drug reactions noted. Patient remains safe at this time and will continue to monitor.    03/25/24 1600  Psych Admission Type (Psych Patients Only)  Admission Status Voluntary  Psychosocial Assessment  Patient Complaints Anxiety;Depression  Eye Contact Fair  Facial Expression Animated;Anxious  Affect Anxious  Speech Logical/coherent  Interaction Assertive  Motor Activity Other (Comment) (WNL)  Appearance/Hygiene Unremarkable  Behavior Characteristics Cooperative;Appropriate to situation  Mood Anxious;Pleasant  Thought Process  Coherency WDL  Content WDL  Delusions None reported or observed  Perception WDL  Hallucination None reported or observed  Judgment Limited  Confusion None  Danger to Self  Current suicidal ideation? Denies  Danger to Others  Danger to Others None reported or observed     03/25/24 1600  Psych Admission Type (Psych Patients Only)  Admission Status Voluntary  Psychosocial Assessment  Patient Complaints Anxiety;Depression  Eye Contact Fair  Facial Expression Animated;Anxious  Affect Anxious  Speech Logical/coherent  Interaction Assertive  Motor Activity Other (Comment) (WNL)  Appearance/Hygiene Unremarkable  Behavior  Characteristics Cooperative;Appropriate to situation  Mood Anxious;Pleasant  Thought Process  Coherency WDL  Content WDL  Delusions None reported or observed  Perception WDL  Hallucination None reported or observed  Judgment Limited  Confusion None  Danger to Self  Current suicidal ideation? Denies  Danger to Others  Danger to Others None reported or observed

## 2024-03-25 NOTE — Progress Notes (Signed)
   03/24/24 2047  Psych Admission Type (Psych Patients Only)  Admission Status Voluntary  Psychosocial Assessment  Patient Complaints Anxiety;Depression  Eye Contact Fair  Facial Expression Animated;Anxious  Affect Anxious  Speech Logical/coherent  Interaction Assertive  Motor Activity Other (Comment) (WDL)  Appearance/Hygiene Unremarkable  Behavior Characteristics Cooperative;Appropriate to situation  Mood Anxious;Pleasant  Thought Process  Coherency WDL  Content WDL  Delusions None reported or observed  Perception WDL  Hallucination None reported or observed  Judgment Limited  Confusion None  Danger to Self  Current suicidal ideation? Denies  Danger to Others  Danger to Others None reported or observed

## 2024-03-25 NOTE — Group Note (Signed)
 Date:  03/25/2024 Time:  10:16 PM  Group Topic/Focus:  Goals Group:   The focus of this group is to help patients establish daily goals to achieve during treatment and discuss how the patient can incorporate goal setting into their daily lives to aide in recovery. Wrap-Up Group:   The focus of this group is to help patients review their daily goal of treatment and discuss progress on daily workbooks.    Participation Level:  Active  Participation Quality:  Sharing  Affect:  Appropriate  Cognitive:  Appropriate  Insight: Good  Engagement in Group:  Engaged  Modes of Intervention:  Discussion  Additional Comments:    Joann Mu 03/25/2024, 10:16 PM

## 2024-03-25 NOTE — Group Note (Signed)
 Date:  03/25/2024 Time:  9:28 AM  Group Topic/Focus:  Goals Group:   The focus of this group is to help patients establish daily goals to achieve during treatment and discuss how the patient can incorporate goal setting into their daily lives to aide in recovery.    Participation Level:  Active  Participation Quality:  Appropriate  Affect:  Appropriate  Cognitive:  Alert and Appropriate  Insight: Appropriate and Good  Engagement in Group:  Engaged  Modes of Intervention:  Discussion  Additional Comments:  Pt state that she wants to work on a list of things she wants to do with her family,and things she wish to do for herself.  Tita Form 03/25/2024, 9:28 AM

## 2024-03-26 MED ORDER — TRAZODONE HCL 100 MG PO TABS: 100.0000 mg | ORAL_TABLET | Freq: Every day | ORAL | 0 refills | Status: DC

## 2024-03-26 MED ORDER — LORAZEPAM 0.5 MG PO TABS: 0.5000 mg | ORAL_TABLET | Freq: Three times a day (TID) | ORAL | 0 refills | Status: DC | PRN

## 2024-03-26 MED ORDER — HYDROXYZINE HCL 25 MG PO TABS: 50.0000 mg | ORAL_TABLET | Freq: Three times a day (TID) | ORAL | 0 refills | Status: DC | PRN

## 2024-03-26 MED ORDER — ARIPIPRAZOLE 5 MG PO TABS: 5.0000 mg | ORAL_TABLET | Freq: Every day | ORAL | 0 refills | Status: DC

## 2024-03-26 MED ORDER — VENLAFAXINE HCL ER 37.5 MG PO CP24: 112.5000 mg | ORAL_CAPSULE | Freq: Every day | ORAL | 0 refills | Status: DC

## 2024-03-26 NOTE — Progress Notes (Signed)
   03/25/24 2248  Psych Admission Type (Psych Patients Only)  Admission Status Voluntary  Psychosocial Assessment  Patient Complaints Anxiety;Depression  Eye Contact Fair  Facial Expression Animated;Anxious  Affect Anxious  Speech Logical/coherent  Interaction Assertive  Motor Activity Other (Comment)  Appearance/Hygiene Unremarkable (WDL)  Behavior Characteristics Cooperative  Mood Anxious;Pleasant  Thought Process  Coherency WDL  Content WDL  Delusions None reported or observed  Perception WDL  Hallucination None reported or observed  Judgment Limited  Confusion None  Danger to Self  Current suicidal ideation? Denies  Danger to Others  Danger to Others None reported or observed

## 2024-03-26 NOTE — Progress Notes (Signed)
 Patient ID: Brenda Martinez, female   DOB: [birth year], 58 y.o.   MRN: [id number] Nurs Dischg Note:   D:Patient denies SI/HI/AVH at this time. Pt appears calm and cooperative, and no distress noted. Pt thanked staff for care on unit.   A: All Personal items in locker returned to pt. Pt given AVS/ SRA / BH Transition/ Suicide safety plan.  Pt escorted to the lobby and husband was present for pick up.   R:  Pt States she will comply with outpatient / other services, and take MEDS as prescribed.

## 2024-03-26 NOTE — Progress Notes (Signed)
 Pt presents as bright with full range affect and euthymic mood.  She is forward thinking to discharge and is excited and mildly anxious simultaneously.  Pt denied SI/HI/AVH and any pain.     03/26/24 0900  Psych Admission Type (Psych Patients Only)  Admission Status Voluntary  Psychosocial Assessment  Patient Complaints Anxiety;Other (Comment) (Pt stated she has a bit of anxiety just about discharging but was aware this was normal.)  Eye Contact Fair  Facial Expression Animated  Affect Anxious  Speech Logical/coherent  Interaction Assertive  Motor Activity Fidgety  Appearance/Hygiene Unremarkable  Behavior Characteristics Cooperative  Mood Pleasant;Euthymic  Thought Process  Coherency WDL  Content WDL  Delusions None reported or observed  Perception WDL  Hallucination None reported or observed  Judgment Limited  Confusion None  Danger to Self  Current suicidal ideation? Denies  Danger to Others  Danger to Others None reported or observed

## 2024-03-26 NOTE — Plan of Care (Signed)
  Problem: Education: Goal: Knowledge of [facility] Education information/materials will improve Outcome: Adequate for Discharge Goal: Emotional status will improve Outcome: Adequate for Discharge Goal: Mental status will improve Outcome: Adequate for Discharge Goal: Verbalization of understanding the information provided will improve Outcome: Adequate for Discharge   Problem: Activity: Goal: Interest or engagement in activities will improve Outcome: Adequate for Discharge Goal: Sleeping patterns will improve Outcome: Adequate for Discharge   Problem: Coping: Goal: Ability to verbalize frustrations and anger appropriately will improve Outcome: Adequate for Discharge Goal: Ability to demonstrate self-control will improve Outcome: Adequate for Discharge   Problem: Health Behavior/Discharge Planning: Goal: Identification of resources available to assist in meeting health care needs will improve Outcome: Adequate for Discharge Goal: Compliance with treatment plan for underlying cause of condition will improve Outcome: Adequate for Discharge   Problem: Physical Regulation: Goal: Ability to maintain clinical measurements within normal limits will improve Outcome: Adequate for Discharge   Problem: Safety: Goal: Periods of time without injury will increase Outcome: Adequate for Discharge   Problem: Education: Goal: Knowledge of  General Education information/materials will improve Outcome: Adequate for Discharge Goal: Emotional status will improve Outcome: Adequate for Discharge Goal: Mental status will improve Outcome: Adequate for Discharge Goal: Verbalization of understanding the information provided will improve Outcome: Adequate for Discharge   Problem: Activity: Goal: Interest or engagement in activities will improve Outcome: Adequate for Discharge Goal: Sleeping patterns will improve Outcome: Adequate for Discharge   Problem: Coping: Goal:  Ability to verbalize frustrations and anger appropriately will improve Outcome: Adequate for Discharge Goal: Ability to demonstrate self-control will improve Outcome: Adequate for Discharge   Problem: Health Behavior/Discharge Planning: Goal: Identification of resources available to assist in meeting health care needs will improve Outcome: Adequate for Discharge Goal: Compliance with treatment plan for underlying cause of condition will improve Outcome: Adequate for Discharge   Problem: Physical Regulation: Goal: Ability to maintain clinical measurements within normal limits will improve Outcome: Adequate for Discharge   Problem: Safety: Goal: Periods of time without injury will increase Outcome: Adequate for Discharge   Problem: Activity: Goal: Interest or engagement in leisure activities will improve Outcome: Adequate for Discharge Goal: Imbalance in normal sleep/wake cycle will improve Outcome: Adequate for Discharge   Problem: Coping: Goal: Coping ability will improve Outcome: Adequate for Discharge Goal: Will verbalize feelings Outcome: Adequate for Discharge   Problem: Health Behavior/Discharge Planning: Goal: Ability to make decisions will improve Outcome: Adequate for Discharge Goal: Compliance with therapeutic regimen will improve Outcome: Adequate for Discharge   Problem: Role Relationship: Goal: Will demonstrate positive changes in social behaviors and relationships Outcome: Adequate for Discharge   Problem: Safety: Goal: Ability to disclose and discuss suicidal ideas will improve Outcome: Adequate for Discharge Goal: Ability to identify and utilize support systems that promote safety will improve Outcome: Adequate for Discharge   Problem: Coping: Goal: Ability to identify and develop effective coping behavior will improve Outcome: Adequate for Discharge   Problem: Self-Concept: Goal: Ability to identify factors that promote anxiety will  improve Outcome: Adequate for Discharge Goal: Level of anxiety will decrease Outcome: Adequate for Discharge Goal: Ability to modify response to factors that promote anxiety will improve Outcome: Adequate for Discharge   Problem: Health Behavior/Discharge Planning: Goal: Identification of resources available to assist in meeting health care needs will improve Outcome: Adequate for Discharge   Problem: Self-Concept: Goal: Will verbalize positive feelings about self Outcome: Adequate for Discharge

## 2024-03-26 NOTE — Progress Notes (Signed)
  [facility] Adult Case Management Discharge Plan :  Will you be returning to the same living situation after discharge:  Yes,  Private residence At discharge, do you have transportation home?: Yes,  Spouse Do you have the ability to pay for your medications: Yes,  has medical coverage  Release of information consent forms completed and in the chart;  Patient's signature needed at discharge.  Patient to Follow up at:  Follow-up Information     Daphine Eagle, MD Follow up on 03/25/2024.   Specialty: Psychiatry Why: You have an appointment for medication management services on 03/25/24 at 4:30 pm, Virtual.  * Please call to confirm this appointment. Contact information: [address] [place] [address] [phone]         Services, [facility] Follow up.   Specialty: Behavioral Health Why: You may call this provider to schedule appointments for therapy and medication management services. Contact information: [address] [place] [address] [place] [address] [phone]         [facility]. Schedule an appointment as soon as possible for a visit in 1 week(s).   Why: You may call this provider to schedule appointments for therapy and medication management services Contact information: [address] [place]  [phone]                Next level of care provider has access to [facility] Link:no  Safety Planning and Suicide Prevention discussed: Yes,  Trinita Devlin (spouse)     Has patient been referred to the Quitline?: Patient does not use tobacco/nicotine products  Patient has been referred for addiction treatment: No known substance use disorder.  Ashyra Cantin D Franshesca Chipman, LCSW 03/26/2024, 10:39 AM

## 2024-03-26 NOTE — Discharge Instructions (Signed)
 A note from your doctor: It was a pleasure taking care of you while you were with us . I want to stress once more that medications are part of, but not ALL of what we must do to take care of ourselves mentally.   We all need:  - Physical activity - at least 3, but preferably 5 days per week where we exercise until the point we get out of breath - Good restful sleep - avoid using a phone or screens in bed, sleep in a dark room, use a white noise machine or app and if you are having trouble sleeping, consult your doctor. - To reduce use of substances - alcohol and tobacco are shown to be harmful to many parts of our health, if you need help, ask for it. - To find purpose - whether it is caring for loved ones, volunteering, pursuing hobbies, or doing work that means something to you, try and live in accordance with your values. - Healthy relationships - seek healthy relationships where the other person helps you grow and challenges you to be your best. If it does not feel good, seek assistance.  Addie Addison, MD [facility] Psychiatry Resident  Below are some things I think may be helpful to you: ___ Learning about therapy: The term therapy can mean many things. There are many different types of psychotherapy, some are short-term, others take a longer period of time. A good therapist will meet with you, discuss your goals, and help you set a treatment plan that addresses your major concerns, regardless of what type of therapy they practice. If the first therapist is not a great fit, try another one.   The American Psychological Association (the APA) has resources to learn about different kinds of therapy.  Here is a link to learn more about the different kinds of psychotherapy:     ----- Outpatient Therapy and Psychiatry Resources for Patients: Your psychiatric needs would be well-served by consultation and regular meetings with an outpatient therapist to assist you with your  mood-related conditions. Here are a series of links for finding a therapist or psychiatrist.    Includes links to the following: [facility] (http://wilson-mayo.com/) (only for [facility] and please reserve for uninsured) [facility] (http://blankenship-martinez.net/) Psychology Today Special educational needs teacher (https://www.psychologytoday.com/us Lucina Sabal) Psychology Today Support Group Tax inspector (https://www.psychologytoday.com/us /groups/) [employer] - [employer] Location (https://carolinabehavioralcare.com/staff-location/[place]/) [facility] [place] - Support Group Finder - (RecordDebt.fi) [facility] [employer] - Lexicographer (https://fspcares.org/contact/) [employer] for Mental Health [place] - NAMI (https://namiguilford.org/support-and-education/support-groups/) Interior and spatial designer Health - Affiliated with [facility] (https://www.Bowman.com/lb/locations/profile/[facility]-behavioral-medicine-at-[address]/) Dept of Health and Human Services - Find a mental health facility (http://lester.info/)

## 2024-03-26 NOTE — [facility] Suicide Risk Assessment (Signed)
 Suicide Risk Assessment  Discharge Assessment    [facility] Discharge Suicide Risk Assessment   Principal Problem: GAD (generalized anxiety disorder) Discharge Diagnoses: Principal Problem:   GAD (generalized anxiety disorder) Active Problems:   Severe recurrent major depression without psychotic features (HCC)  During the patient's hospitalization, patient had extensive initial psychiatric evaluation, and follow-up psychiatric evaluations every day.  Psychiatric diagnoses provided upon initial assessment:  Major Depressive Disorder, Severe recurrent, without psychotic features Generalized Anxiety Disorder  Patient's psychiatric medications were adjusted on admission: Trazodone  100 mg, for sleep ativan  1 mg TID, for acute anxiety venlafaxine  75 mg, for Generalized Anxiety Disorder, Major Depressive Disorder hydroxyzine  25 mg TID PRN for mild anxiety  During the hospitalization, other adjustments were made to the patient's psychiatric medication regimen:  Titrated venlafaxine  Titrated hydroxyzine  Started aripiprazole  5 mg for depression augmentation  Patient's care was discussed during the interdisciplinary team meeting every day during the hospitalization.  The patient denied having side effects to prescribed psychiatric medication.  Gradually, patient started adjusting to milieu. The patient was evaluated each day by a clinical provider to ascertain response to treatment. Improvement was noted by the patient's report of decreasing symptoms, improved sleep and appetite, affect, medication tolerance, behavior, and participation in unit programming.  Patient was asked each day to complete a self inventory noting mood, mental status, pain, new symptoms, anxiety and concerns.   Symptoms were reported as significantly decreased or resolved completely by discharge.  The patient reports that their mood is stable.  The patient denied having suicidal thoughts for more than 48 hours prior to  discharge.  Patient denies having homicidal thoughts.  Patient denies having auditory hallucinations.  Patient denies any visual hallucinations or other symptoms of psychosis.  The patient was motivated to continue taking medication with a goal of continued improvement in mental health.   The patient reports their target psychiatric symptoms of anxiety, depression, insomnia, inability to stop worrying, tearfulness  responded well to the psychiatric medications, and the patient reports overall benefit other psychiatric hospitalization. Supportive psychotherapy was provided to the patient. The patient also participated in regular group therapy while hospitalized. Coping skills, problem solving as well as relaxation therapies were also part of the unit programming.  Labs were reviewed with the patient, and abnormal results were discussed with the patient.  The patient is able to verbalize their individual safety plan to this provider.  # It is recommended to the patient to continue psychiatric medications as prescribed, after discharge from the hospital.    # It is recommended to the patient to follow up with your outpatient psychiatric provider and PCP.  # It was discussed with the patient, the impact of alcohol, drugs, tobacco have been there overall psychiatric and medical wellbeing, and total abstinence from substance use was recommended the patient.ed.  # Prescriptions provided or sent directly to preferred pharmacy at discharge. Patient agreeable to plan. Given opportunity to ask questions. Appears to feel comfortable with discharge.    # In the event of worsening symptoms, the patient is instructed to call the crisis hotline, 911 and or go to the nearest ED for appropriate evaluation and treatment of symptoms. To follow-up with primary care provider for other medical issues, concerns and or health care needs  # Patient was discharged home with a plan to follow up as noted below.    On day  of discharge met patient in assessment room shortly after the administration of her medications. She reported a good night of  sleep (mentions she slept through the thunderstorm overnight.  She is excited about discharge and is looking forward to moving forward with her life. She reports her anxiety and depression as both having significantly improved during the course of this hospitalization.  Patient had a number of questions about finding a new prescriber and finding a therapist.  Discussed a couple of options, including several that are within the [facility] health system who will be able to easily see all of the records written during this admission.  Patient inquired whether note writer could see her as an outpatient, told her that residents are not permitted to have their own outpatients until their third year of training.  Patient denies SI/HI/AVH/paranoia/delusions.  She reports no side effects from medication, and denies any new somatic complaints.  Total Time spent with patient: 30 minutes  Musculoskeletal: Strength & Muscle Tone: within normal limits Gait & Station: normal Patient leans: N/A  Psychiatric Specialty Exam  Presentation  General Appearance: Appropriate for Environment  Eye Contact: Good  Speech: Clear and Coherent; Normal Rate  Speech Volume: Normal  Handedness: Right   Mood and Affect  Mood: Euthymic  Duration of Depression Symptoms: No data recorded Affect: Congruent; Appropriate  Thought Process  Thought Processes: Coherent; Goal Directed; Linear  Descriptions of Associations:Intact  Orientation:Full (Time, Place and Person)  Thought Content:Abstract Reasoning; Logical  History of Schizophrenia/Schizoaffective disorder:No data recorded Duration of Psychotic Symptoms:No data recorded Hallucinations:Hallucinations: None  Ideas of Reference:None  Suicidal Thoughts:Suicidal Thoughts: No  Homicidal Thoughts:Homicidal Thoughts: No   Sensorium   Memory: Immediate Good; Recent Good; Remote Good  Judgment: Good  Insight: Good   Executive Functions  Concentration: Good  Attention Span: Good  Recall: Good  Fund of Knowledge: Good  Language: Good   Psychomotor Activity  Psychomotor Activity:Psychomotor Activity: Normal   Assets  Assets: Desire for Improvement; Communication Skills; Financial Resources/Insurance; Housing; Intimacy; Physical Health; Social Support   Sleep  Sleep:Sleep: Good  Estimated Sleeping Duration (Last 24 Hours): 6.00-7.25 hours  Physical Exam: Physical Exam Constitutional:      General: She is not in acute distress.    Appearance: Normal appearance. She is not ill-appearing.  HENT:     Head: Normocephalic and atraumatic.     Nose: Nose normal.  Pulmonary:     Effort: Pulmonary effort is normal.   Skin:    General: Skin is warm and dry.   Neurological:     Mental Status: She is alert and oriented to person, place, and time.   Psychiatric:        Attention and Perception: Attention and perception normal.        Mood and Affect: Affect normal. Mood is anxious.        Speech: Speech normal.        Behavior: Behavior normal.        Thought Content: Thought content normal.        Cognition and Memory: Cognition and memory normal.        Judgment: Judgment normal.    Review of Systems  Constitutional: Negative.   HENT: Negative.    Eyes: Negative.   Respiratory: Negative.    Cardiovascular: Negative.   Gastrointestinal: Negative.   Genitourinary: Negative.   Musculoskeletal: Negative.   Skin: Negative.   Neurological:  Positive for tingling.  Endo/Heme/Allergies: Negative.   Psychiatric/Behavioral:  Negative for depression, hallucinations, memory loss, substance abuse and suicidal ideas. The patient is nervous/anxious. The patient does not have insomnia.  Blood pressure 108/66, pulse 94, temperature 97.6 F (36.4 C), temperature source Oral, resp. rate 16,  height 5' 9 (1.753 m), weight 68.9 kg, last menstrual period 01/27/2012, SpO2 98%. Body mass index is 22.45 kg/m.  Mental Status Per Nursing Assessment::   On Admission:  NA  Demographic Factors:  Caucasian and Unemployed  Loss Factors: Decrease in vocational status and acute illness of close family member  Historical Factors: NA  Risk Reduction Factors:   Sense of responsibility to family, Religious beliefs about death, Living with another person, especially a relative, Positive social support, and Positive coping skills or problem solving skills  Continued Clinical Symptoms:  Severe Anxiety and/or Agitation Depression:   Insomnia More than one psychiatric diagnosis  Cognitive Features That Contribute To Risk:  Thought constriction (tunnel vision)    Suicide Risk:  Mild:  There are no identifiable suicide plans, no associated intent, mild dysphoria and related symptoms, good self-control (both objective and subjective assessment), few other risk factors, and identifiable protective factors, including available and accessible social support.    Follow-up Information     Daphine Eagle, MD Follow up on 03/25/2024.   Specialty: Psychiatry Why: You have an appointment for medication management services on 03/25/24 at 4:30 pm, Virtual.  * Please call to confirm this appointment. Contact information: [address] [place] [address] [phone]         Services, [facility] Follow up.   Specialty: Behavioral Health Why: You may call this provider to schedule appointments for therapy and medication management services. Contact information: [address] [place] [address] [place] [address] [phone]                  Discharge Instructions      A note from your doctor: It was a pleasure taking care of you while you were with us . I want to stress once more that medications are part of, but not ALL of what we must do to take care of  ourselves mentally.   We all need:  - Physical activity - at least 3, but preferably 5 days per week where we exercise until the point we get out of breath - Good restful sleep - avoid using a phone or screens in bed, sleep in a dark room, use a white noise machine or app and if you are having trouble sleeping, consult your doctor. - To reduce use of substances - alcohol and tobacco are shown to be harmful to many parts of our health, if you need help, ask for it. - To find purpose - whether it is caring for loved ones, volunteering, pursuing hobbies, or doing work that means something to you, try and live in accordance with your values. - Healthy relationships - seek healthy relationships where the other person helps you grow and challenges you to be your best. If it does not feel good, seek assistance.  Addie Addison, MD [facility] Psychiatry Resident  Below are some things I think may be helpful to you: ___ Learning about therapy: The term therapy can mean many things. There are many different types of psychotherapy, some are short-term, others take a longer period of time. A good therapist will meet with you, discuss your goals, and help you set a treatment plan that addresses your major concerns, regardless of what type of therapy they practice. If the first therapist is not a great fit, try another one.   The American Psychological Association (the APA)  has resources to learn about different kinds of therapy.  Here is a link to learn more about the different kinds of psychotherapy:     ----- Outpatient Therapy and Psychiatry Resources for Patients: Your psychiatric needs would be well-served by consultation and regular meetings with an outpatient therapist to assist you with your mood-related conditions. Here are a series of links for finding a therapist or psychiatrist.    Includes links to the following: [facility]  (http://wilson-mayo.com/) (only for [facility] and please reserve for uninsured) [facility] [place] (http://blankenship-martinez.net/) Psychology Today Special educational needs teacher (https://www.psychologytoday.com/us Lucina Sabal) Psychology Today Support Group Tax inspector (https://www.psychologytoday.com/us /groups/) [employer] - [employer] Location (https://carolinabehavioralcare.com/staff-location/[employer]/) [facility] [place] - Support Group Finder - (RecordDebt.fi) [facility] [employer] - Lexicographer (https://fspcares.org/contact/) [employer] for Mental Health [place] - NAMI (https://namiguilford.org/support-and-education/support-groups/) Interior and spatial designer Health - Affiliated with [facility] (https://www.[place].com/lb/locations/profile/[facility]-at-[address]/) Dept of Health and Human Services - Find a mental health facility (http://lester.info/)      Plan Of Care/Follow-up recommendations:  Activity: as tolerated  Diet: heart healthy  Other: -Follow-up with your outpatient psychiatric provider -instructions on appointment date, time, and address (location) are provided to you in discharge paperwork.  -Take your psychiatric medications as prescribed at discharge - instructions are provided to you in the discharge paperwork  -Follow-up with outpatient primary care doctor and other specialists -for management of chronic medical disease, including:  Mild elevation of LDL cholesterol  -Testing: Follow-up with outpatient provider for abnormal lab results: N/A.   -Recommend abstinence from alcohol, tobacco, and other illicit drug use at discharge.   -If your psychiatric symptoms recur, worsen, or if you have side  effects to your psychiatric medications, call your outpatient psychiatric provider, 911, [phone] or go to the nearest emergency department.  -If suicidal thoughts recur, call your outpatient psychiatric provider, 911, 988 or go to the nearest emergency department.   Lundy Salisbury, MD 03/26/2024, 8:58 AM

## 2024-03-26 NOTE — Progress Notes (Signed)
 Adult Psychoeducational Group Note  Date:  03/26/2024 Time:  9:53 AM  Group Topic/Focus:  Goals Group:   The focus of this group is to help patients establish daily goals to achieve during treatment and discuss how the patient can incorporate goal setting into their daily lives to aide in recovery.  Participation Level:  Active  Participation Quality:  Appropriate  Affect:  Appropriate  Cognitive:  Appropriate  Insight: Appropriate  Engagement in Group:  Engaged  Modes of Intervention:  Discussion  Additional Comments:  Pt stated she is feeling good.  Pt goal for the day is to be discharged.  Butch Cashing D 03/26/2024, 9:53 AM

## 2024-03-26 NOTE — Discharge Summary (Signed)
 Physician Discharge Summary Note  Patient:  Brenda Martinez is an 58 y.o., female MRN:  [id number] DOB:  [birth year] Patient phone:  [phone] (home)  Patient address:   [address] [place] [address],  Total Time spent with patient: 30 minutes  Date of Admission:  03/21/2024 Date of Discharge: 03/26/2024  Reason for Admission:  Acute, intractable anxiety and major depression  Principal Problem: GAD (generalized anxiety disorder) Discharge Diagnoses: Principal Problem:   GAD (generalized anxiety disorder) Active Problems:   Severe recurrent major depression without psychotic features (HCC)   Past Psychiatric History:  Past Psychiatric Hx: Previous Psych Diagnoses: GAD, MDD Prior inpatient treatment: No  Current/prior outpatient treatment: Yes, Dr Deborra Falter Prior rehab hx: denies Psychotherapy hx: denies History of suicide: denies History of homicide or aggression: denies Psychiatric medication history: escitalopram, buproprion 150, vilazodone, sertraline, esketamine, buspirone, duloxetine, hydroxyzine , lorazepam  Psychiatric medication compliance history: good Neuromodulation history: denies Current Psychiatrist:Phone: [phone] Fax [phone] Daphine Eagle, MD Current therapist: na   Substance Abuse Hx: Alcohol: drinks <1 glass of wine per week Tobacco: never Illicit drugs: never Rx drug abuse: denies, PDMP review conducted 03/22/2024 shows Average Risk. One prescriber.  Rehab hx: no   Past Medical History: Medical Diagnoses: HTN Home Rx: rosuvastatin  Prior Hosp: Prior Surgeries/Trauma: 2x c-sections, abdominal hernia repair Head trauma, LOC, concussions, seizures: Denies Allergies: Denies LMP: postmenopausal PCP: Rayma Calandra, [phone]   Family History: Medical: Mother has had 3x strokes, father passed away. Psych: Sister with severe anxiety. Maternal grandmother had bipolar disorder Psych Rx: Duloxetine 30 mg for sister seems to  help SA/HA: denies Substance use family hx: denies   Social History: Childhood (bring, raised, lives now, parents, siblings, schooling, education): lives in [place], originally from [place] . Abuse: denies Marital Status: married 35 years.  Sexual orientation: straight Children: 2x sons. 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 No pertinent surgical history. Family History:  Family History  Problem Relation Age of Onset   Heart disease Father        CABG   Heart attack Maternal Grandfather    Heart attack Paternal Grandfather    Family Psychiatric  History: see above Social History:  Social History   Substance and Sexual Activity  Alcohol Use No     Social History   Substance and Sexual Activity  Drug Use No    Social History   Socioeconomic History   Marital status: Married    Spouse name: Not on file   Number of children: Not on file   Years of education: Not on file   Highest education level: Not on file  Occupational History   Not on file  Tobacco Use   Smoking status: Never   Smokeless tobacco: Never  Vaping Use   Vaping status: Never Used  Substance and Sexual Activity   Alcohol use: No   Drug use: No   Sexual activity: Yes    Birth control/protection: Pill  Other Topics Concern   Not on file  Social History Narrative  Not on file   Social Drivers of Health   Financial Resource Strain: Not on file  Food Insecurity: No Food Insecurity (03/21/2024)   Hunger Vital Sign    Worried About Running Out of Food in the Last  Year: Never true    Ran Out of Food in the Last Year: Never true  Transportation Needs: No Transportation Needs (03/21/2024)   PRAPARE - Administrator, Civil Service (Medical): No    Lack of Transportation (Non-Medical): No  Physical Activity: Not on file  Stress: Not on file  Social Connections: Not on file    Hospital Course:  During the patient's hospitalization, patient had extensive initial psychiatric evaluation, and follow-up psychiatric evaluations every day.   Psychiatric diagnoses provided upon initial assessment:  Major Depressive Disorder, Severe recurrent, without psychotic features Generalized Anxiety Disorder   Patient's psychiatric medications were adjusted on admission: Trazodone  100 mg, for sleep ativan  1 mg TID, for acute anxiety venlafaxine  75 mg, for Generalized Anxiety Disorder, Major Depressive Disorder hydroxyzine  25 mg TID PRN for mild anxiety   During the hospitalization, other adjustments were made to the patient's psychiatric medication regimen:  Titrated venlafaxine  Titrated hydroxyzine  Started aripiprazole  5 mg for depression augmentation   Patient's care was discussed during the interdisciplinary team meeting every day during the hospitalization.   The patient denied having side effects to prescribed psychiatric medication.   Gradually, patient started adjusting to milieu. The patient was evaluated each day by a clinical provider to ascertain response to treatment. Improvement was noted by the patient's report of decreasing symptoms, improved sleep and appetite, affect, medication tolerance, behavior, and participation in unit programming.  Patient was asked each day to complete a self inventory noting mood, mental status, pain, new symptoms, anxiety and concerns.   Symptoms were reported as significantly decreased or resolved completely by discharge.  The patient reports that their mood is stable.  The patient denied having suicidal  thoughts for more than 48 hours prior to discharge.  Patient denies having homicidal thoughts.  Patient denies having auditory hallucinations.  Patient denies any visual hallucinations or other symptoms of psychosis.  The patient was motivated to continue taking medication with a goal of continued improvement in mental health.    The patient reports their target psychiatric symptoms of anxiety, depression, insomnia, inability to stop worrying, tearfulness  responded well to the psychiatric medications, and the patient reports overall benefit other psychiatric hospitalization. Supportive psychotherapy was provided to the patient. The patient also participated in regular group therapy while hospitalized. Coping skills, problem solving as well as relaxation therapies were also part of the unit programming.   Labs were reviewed with the patient, and abnormal results were discussed with the patient.   The patient is able to verbalize their individual safety plan to this provider.   # It is recommended to the patient to continue psychiatric medications as prescribed, after discharge from the hospital.     # It is recommended to the patient to follow up with your outpatient psychiatric provider and PCP.   # It was discussed with the patient, the impact of alcohol, drugs, tobacco have been there overall psychiatric and medical wellbeing, and total abstinence from substance use was recommended the patient.ed.   # Prescriptions provided or sent directly to preferred pharmacy at discharge. Patient agreeable to plan. Given opportunity to ask questions. Appears to feel comfortable with discharge.    # In the event of worsening symptoms, the patient is instructed  to call the crisis hotline, 911 and or go to the nearest ED for appropriate evaluation and treatment of symptoms. To follow-up with primary care provider for other medical issues, concerns and or health care needs   # Patient was discharged home with a  plan to follow up as noted below.       On day of discharge met patient in assessment room shortly after the administration of her medications. She reported a good night of sleep (mentions she slept through the thunderstorm overnight.  She is excited about discharge and is looking forward to moving forward with her life. She reports her anxiety and depression as both having significantly improved during the course of this hospitalization.  Patient had a number of questions about finding a new prescriber and finding a therapist.  Discussed a couple of options, including several that are within the [facility] health system who will be able to easily see all of the records written during this admission.  Patient inquired whether note writer could see her as an outpatient, told her that residents are not permitted to have their own outpatients until their third year of training.  Patient denies SI/HI/AVH/paranoia/delusions.  She reports no side effects from medication, and denies any new somatic complaints.  Physical Findings: AIMS:  , ,  ,  ,  ,  ,   CIWA:    COWS:     Musculoskeletal: Strength & Muscle Tone: within normal limits Gait & Station: normal Patient leans: N/A   Psychiatric Specialty Exam   Presentation  General Appearance: Appropriate for Environment   Eye Contact: Good   Speech: Clear and Coherent; Normal Rate   Speech Volume: Normal   Handedness: Right     Mood and Affect  Mood: Euthymic   Duration of Depression Symptoms: No data recorded Affect: Congruent; Appropriate   Thought Process  Thought Processes: Coherent; Goal Directed; Linear   Descriptions of Associations:Intact   Orientation:Full (Time, Place and Person)   Thought Content:Abstract Reasoning; Logical   History of Schizophrenia/Schizoaffective disorder:No data recorded Duration of Psychotic Symptoms:No data recorded Hallucinations:Hallucinations: None   Ideas of Reference:None   Suicidal  Thoughts:Suicidal Thoughts: No   Homicidal Thoughts:Homicidal Thoughts: No     Sensorium  Memory: Immediate Good; Recent Good; Remote Good   Judgment: Good   Insight: Good     Executive Functions  Concentration: Good   Attention Span: Good   Recall: Good   Fund of Knowledge: Good   Language: Good     Psychomotor Activity  Psychomotor Activity:Psychomotor Activity: Normal     Assets  Assets: Desire for Improvement; Communication Skills; Financial Resources/Insurance; Housing; Intimacy; Physical Health; Social Support     Sleep  Sleep:Sleep: Good   Estimated Patient Durations  Estimated Sleeping Duration (Last 24 Hours): 6.00-7.25 hours     Physical Exam: Physical Exam Constitutional:      General: She is not in acute distress.    Appearance: Normal appearance. She is not ill-appearing.  HENT:     Head: Normocephalic and atraumatic.     Nose: Nose normal.  Pulmonary:     Effort: Pulmonary effort is normal.    Skin:    General: Skin is warm and dry.    Neurological:     Mental Status: She is alert and oriented to person, place, and time.    Psychiatric:        Attention and Perception: Attention and perception normal.        Mood and Affect:  Affect normal. Mood is anxious.        Speech: Speech normal.        Behavior: Behavior normal.        Thought Content: Thought content normal.        Cognition and Memory: Cognition and memory normal.        Judgment: Judgment normal.      Review of Systems  Constitutional: Negative.   HENT: Negative.    Eyes: Negative.   Respiratory: Negative.    Cardiovascular: Negative.   Gastrointestinal: Negative.   Genitourinary: Negative.   Musculoskeletal: Negative.   Skin: Negative.   Neurological:  Positive for tingling.  Endo/Heme/Allergies: Negative.   Psychiatric/Behavioral:  Negative for depression, hallucinations, memory loss, substance abuse and suicidal ideas. The patient is nervous/anxious.  The patient does not have insomnia.     Blood pressure 108/66, pulse 94, temperature 97.6 F (36.4 C), temperature source Oral, resp. rate 16, height 5' 9 (1.753 m), weight 68.9 kg, last menstrual period 01/27/2012, SpO2 98%. Body mass index is 22.45 kg/m.     Social History   Tobacco Use  Smoking Status Never  Smokeless Tobacco Never   Tobacco Cessation:  N/A, patient does not currently use tobacco products   Blood Alcohol level:  No results found for: [facility]  Metabolic Disorder Labs:  Lab Results  Component Value Date   HGBA1C 5.4 03/25/2024   MPG 108.28 03/25/2024   No results found for: PROLACTIN Lab Results  Component Value Date   CHOL 199 03/21/2024   TRIG 50 03/21/2024   HDL 73 03/21/2024   CHOLHDL 2.7 03/21/2024   VLDL 10 03/21/2024   LDLCALC 116 (H) 03/21/2024    See Psychiatric Specialty Exam and Suicide Risk Assessment completed by Attending Physician prior to discharge.  Discharge destination:  Home  Is patient on multiple antipsychotic therapies at discharge:  No   Has Patient had three or more failed trials of antipsychotic monotherapy by history:  No  Recommended Plan for Multiple Antipsychotic Therapies: NA  Discharge Instructions     Discharge instructions   Complete by: As directed    Discharge Instructions     A note from your doctor: It was a pleasure taking care of you while you were with us . I want to  stress once more that medications are part of, but not ALL of what we must  do to take care of ourselves mentally.   We all need:  - Physical activity - at least 3, but preferably 5 days per week where we  exercise until the point we get out of breath - Good restful sleep - avoid using a phone or screens in bed, sleep in a  dark room, use a white noise machine or app and if you are having trouble  sleeping, consult your doctor. - To reduce use of substances - alcohol and tobacco are shown to be  harmful to many parts of our  health, if you need help, ask for it. - To find purpose - whether it is caring for loved ones, volunteering,  pursuing hobbies, or doing work that means something to you, try and live  in accordance with your values. - Healthy relationships - seek healthy relationships where the other  person helps you grow and challenges you to be your best. If it does not  feel good, seek assistance.  Addie Addison, MD [facility] Psychiatry Resident  Below are some things I think may be helpful to  you: ___ Learning about therapy: The term therapy can mean many things. There are many different types of  psychotherapy, some are short-term, others take a longer period of time. A  good therapist will meet with you, discuss your goals, and help you set a  treatment plan that addresses your major concerns, regardless of what type  of therapy they practice. If the first therapist is not a great fit, try  another one.   The American Psychological Association (the APA) has resources to learn  about different kinds of therapy.  Here is a link to learn more about the different kinds of psychotherapy:    ----- Outpatient Therapy and Psychiatry Resources for Patients: Your psychiatric needs would be well-served by consultation and regular  meetings with an outpatient therapist to assist you with your mood-related  conditions. Here are a series of links for finding a therapist or  psychiatrist.    Includes links to the following: [facility]  (KenoPicks.se h-centers) (only for [facility] and please reserve for  uninsured) [facility] [place]  ([place].co.za atric-group/) Psychology Today Special educational needs teacher  (https://www.psychologytoday.com/us Lucina Sabal) Psychology Today Support Group Tax inspector   (https://www.psychologytoday.com/us /groups/) [employer] - [employer] Location  (https://carolinabehavioralcare.com/staff-location/[place]/) [facility] [place] - Support Group Finder -  (RecordDebt.fi) Family Services of the [employer] - Lexicographer  (https://fspcares.org/contact/) [employer] for Mental Health [place] - NAMI  (https://namiguilford.org/support-and-education/support-groups/) Interior and spatial designer Health - Affiliated with [facility]  (https://www.[place].com/lb/locations/profile/[facility]-[place]-behaviora l-medicine-at-[address]/) Dept of Health and Human Services - Find a mental health facility  (http://lester.info/)      Allergies as of 03/26/2024   No Known Allergies      Medication List     STOP taking these medications    busPIRone 10 MG tablet Commonly known as: BUSPAR   DULoxetine 30 MG capsule Commonly known as: CYMBALTA   mirtazapine 45 MG tablet Commonly known as: REMERON   Spravato (84 MG Dose) 28 MG/DEVICE Sopk Generic drug: Esketamine HCl (84 MG Dose)       TAKE these medications      Indication  ARIPiprazole  5 MG tablet Commonly known as: ABILIFY  Take 1 tablet (5 mg total) by mouth daily.  Indication: Major Depressive Disorder   hydrOXYzine  25 MG tablet Commonly known as: ATARAX  Take 2 tablets (50 mg total) by mouth every 8 (eight) hours as needed for anxiety. Take 1-2 tablets by mouth up to every 8 (eight) hours as needed for anxiety.  Indication: Feeling Anxious, Feeling Tense   LORazepam  0.5 MG tablet Commonly known as: ATIVAN  Take 1 tablet (0.5 mg total) by mouth every 8 (eight) hours as needed for anxiety. What changed:  when to take this reasons to take this  Indication: Feeling Anxious   rosuvastatin 5 MG tablet Commonly known as: CRESTOR Take 5 mg by mouth daily.  Indication: High Amount of Triglycerides in the Blood    traZODone  100 MG tablet Commonly known as: DESYREL  Take 1 tablet (100 mg total) by mouth at bedtime.  Indication: Anxiety Disorder, Trouble Sleeping, Major Depressive Disorder   venlafaxine  XR 37.5 MG 24 hr capsule Commonly known as: EFFEXOR -XR Take 3 capsules (112.5 mg total) by mouth daily with breakfast.  Indication: Generalized Anxiety Disorder, Major Depressive Disorder        Follow-up Information     Daphine Eagle, MD Follow up on 03/25/2024.   Specialty: Psychiatry Why: You have an appointment for medication management services on 03/25/24 at 4:30 pm, Virtual.  *  Please call to confirm this appointment. Contact information: [address] [place] [address] [phone]         Services, [facility] Follow up.   Specialty: Behavioral Health Why: You may call this provider to schedule appointments for therapy and medication management services. Contact information: [address] [place] [address] [place] [address] [phone]         [facility]. Schedule an appointment as soon as possible for a visit in 1 week(s).   Why: You may call this provider to schedule appointments for therapy and medication management services Contact information: [address] [place]  [phone]                Follow-up recommendations:   Discharge Instructions        A note from your doctor: It was a pleasure taking care of you while you were with us . I want to stress once more that medications are part of, but not ALL of what we must do to take care of ourselves mentally.    We all need:   - Physical activity - at least 3, but preferably 5 days per week where we exercise until the point we get out of breath - Good restful sleep - avoid using a phone or screens in bed, sleep in a dark room, use a white noise machine or app and if you are having trouble sleeping, consult your doctor. - To reduce  use of substances - alcohol and tobacco are shown to be harmful to many parts of our health, if you need help, ask for it. - To find purpose - whether it is caring for loved ones, volunteering, pursuing hobbies, or doing work that means something to you, try and live in accordance with your values. - Healthy relationships - seek healthy relationships where the other person helps you grow and challenges you to be your best. If it does not feel good, seek assistance.   Addie Addison, MD [facility] Psychiatry Resident   Below are some things I think may be helpful to you: ___ Learning about therapy: The term therapy can mean many things. There are many different types of psychotherapy, some are short-term, others take a longer period of time. A good therapist will meet with you, discuss your goals, and help you set a treatment plan that addresses your major concerns, regardless of what type of therapy they practice. If the first therapist is not a great fit, try another one.    The American Psychological Association (the APA) has resources to learn about different kinds of therapy.   Here is a link to learn more about the different kinds of psychotherapy:      ----- Outpatient Therapy and Psychiatry Resources for Patients: Your psychiatric needs would be well-served by consultation and regular meetings with an outpatient therapist to assist you with your mood-related conditions. Here are a series of links for finding a therapist or psychiatrist.      Includes links to the following: [facility] (http://wilson-mayo.com/) (only for [facility] and please reserve for uninsured) [facility] [place] (http://blankenship-martinez.net/) Psychology Today Special educational needs teacher  (https://www.psychologytoday.com/us Lucina Sabal) Psychology Today Support Group Tax inspector (https://www.psychologytoday.com/us /groups/) [employer] - [employer] Location (https://carolinabehavioralcare.com/staff-location/[place]/) [facility] [place] - Support Group Finder - (RecordDebt.fi) Family Services of the [employer] - Lexicographer (https://fspcares.org/contact/) [employer] for Mental Health [place] - NAMI (https://namiguilford.org/support-and-education/support-groups/) [employer]  Health - Affiliated with [facility] (https://www.[place].com/lb/locations/profile/[facility]-[place]-behavioral-medicine-at-[address]/) Dept of Health and Human Services - Find a mental health facility (http://lester.info/)        Plan Of Care/Follow-up recommendations:  Activity: as tolerated   Diet: heart healthy   Other: -Follow-up with your outpatient psychiatric provider -instructions on appointment date, time, and address (location) are provided to you in discharge paperwork.   -Take your psychiatric medications as prescribed at discharge - instructions are provided to you in the discharge paperwork   -Follow-up with outpatient primary care doctor and other specialists -for management of chronic medical disease, including:  Mild elevation of LDL cholesterol   -Testing: Follow-up with outpatient provider for abnormal lab results: N/A.    -Recommend abstinence from alcohol, tobacco, and other illicit drug use at discharge.    -If your psychiatric symptoms recur, worsen, or if you have side effects to your psychiatric medications, call your outpatient psychiatric provider, 911, 988 or go to the nearest emergency department.   -If suicidal thoughts recur, call your outpatient psychiatric provider, 911, 988 or go to the nearest emergency department.    Signed: Lundy Salisbury,  MD 03/26/2024, 3:19 PM

## 2024-03-29 ENCOUNTER — Ambulatory Visit: Admitting: Adult Health

## 2024-03-29 VITALS — BP 112/69 | Ht 68.0 in | Wt 155.0 lb

## 2024-03-29 DIAGNOSIS — F331 Major depressive disorder, recurrent, moderate: Secondary | ICD-10-CM | POA: Diagnosis not present

## 2024-03-30 ENCOUNTER — Encounter: Payer: Self-pay | Admitting: Adult Health

## 2024-03-30 NOTE — Progress Notes (Signed)
 [facility] MD/PA/NP Initial Note  03/31/2024 9:03 AM Brenda Martinez  MRN:  [id number]  Chief Complaint:   Patient seen today for initial psychiatric evaluation.   Recently discharged from [facility] - collateral reviewed.  Previous patient of Dr. Vincente - will request records.  HPI:   Describes mood today as better. Pleasant.  Mood symptoms - reports mood has improved. Reports less depression. Reports feeling anxious and restless since starting the Abilify . She does feel like the Abilify  is helpful. Reports irritability. Denies panic attacks. Reports some worry, rumination and over thinking. Denies obsessive thoughts or acts. Reports mood has improved since recent hospital stay, but is concerned about the restlessness. Stable interest and motivation. Taking medications as prescribed.  Energy levels lower. Active, does not have a regular exercise routine - walking  Enjoys some usual interests and activities. Married x 35 years - 2 sons. Family in [place] . Spending time with family. Appetite adequate.  Weight stable. Sleeps well most nights. Averages 6 hours. Reports focus and concentration difficulties - likes to read and has been struggling. Completing tasks. Managing aspects of household.  Denies SI or HI.  Denies AH or VH. Denies self harm. Denies substance use.  Previous medication trials:   Wellbutrin Lexapro  Visit Diagnosis:    ICD-10-CM   1. Major depressive disorder, recurrent episode, moderate (HCC)  F33.1       Past Psychiatric History: Reports recent admission - [facility].  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  Family Psychiatric History: Denies any family history of mental illness.   Family History:  Family History  Problem Relation Age of Onset   Heart disease Father        CABG   Heart attack  Maternal Grandfather    Heart attack Paternal Grandfather     Social History:  Social History   Socioeconomic History   Marital status: Married    Spouse name: Not on file   Number of children: Not on file   Years of education: Not on file   Highest education level: Not on file  Occupational History   Not on file  Tobacco Use   Smoking status: Never   Smokeless tobacco: Never  Vaping Use   Vaping status: Never Used  Substance and Sexual Activity   Alcohol use: No   Drug use: No   Sexual activity: Yes    Birth control/protection: Pill  Other Topics Concern   Not on file  Social History Narrative   Not on file   Social Drivers of Health   Financial Resource Strain: Not on file  Food Insecurity: No Food Insecurity (03/21/2024)   Hunger Vital Sign    Worried About Running Out of Food in the Last Year: Never true    Ran Out of Food in the Last Year: Never true  Transportation Needs: No Transportation Needs (03/21/2024)   PRAPARE - Administrator, Civil Service (Medical): No    Lack of Transportation (Non-Medical): No  Physical Activity: Not on file  Stress: Not on file  Social Connections: Not on file    Allergies: No Known Allergies  Metabolic Disorder Labs: Lab Results  Component Value Date   HGBA1C 5.4 03/25/2024   MPG 108.28 03/25/2024   No results found for: PROLACTIN  Lab Results  Component Value Date   CHOL 199 03/21/2024   TRIG 50 03/21/2024   HDL 73 03/21/2024   CHOLHDL 2.7 03/21/2024   VLDL 10 03/21/2024   LDLCALC 116 (H) 03/21/2024   Lab Results  Component Value Date   TSH 0.394 03/21/2024    Therapeutic Level Labs: No results found for: LITHIUM No results found for: VALPROATE No results found for: CBMZ  Current Medications: Current Outpatient Medications  Medication Sig Dispense Refill   ARIPiprazole  (ABILIFY ) 5 MG tablet Take 1 tablet (5 mg total) by mouth daily. 30 tablet 0   hydrOXYzine  (ATARAX ) 25 MG tablet Take 2  tablets (50 mg total) by mouth every 8 (eight) hours as needed for anxiety. Take 1-2 tablets by mouth up to every 8 (eight) hours as needed for anxiety. 60 tablet 0   LORazepam  (ATIVAN ) 0.5 MG tablet Take 1 tablet (0.5 mg total) by mouth every 8 (eight) hours as needed for anxiety. 10 tablet 0   rosuvastatin (CRESTOR) 5 MG tablet Take 5 mg by mouth daily.     traZODone  (DESYREL ) 100 MG tablet Take 1 tablet (100 mg total) by mouth at bedtime. 30 tablet 0   venlafaxine  XR (EFFEXOR -XR) 37.5 MG 24 hr capsule Take 3 capsules (112.5 mg total) by mouth daily with breakfast. 90 capsule 0   No current facility-administered medications for this visit.    Medication Side Effects: none  Orders placed this visit:  No orders of the defined types were placed in this encounter.   Psychiatric Specialty Exam:  Review of Systems  Musculoskeletal:  Negative for gait problem.  Neurological:  Negative for tremors.  Psychiatric/Behavioral:         Please refer to HPI    Blood pressure 112/69, height 5' 8 (1.727 m), weight 155 lb (70.3 kg), last menstrual period 01/27/2012.Body mass index is 23.57 kg/m.  General Appearance: Casual and Neat  Eye Contact:  Good  Speech:  Clear and Coherent and Normal Rate  Volume:  Normal  Mood:  Anxious  Affect:  Appropriate and Congruent  Thought Process:  Coherent and Descriptions of Associations: Intact  Orientation:  Full (Time, Place, and Person)  Thought Content: Logical   Suicidal Thoughts:  No  Homicidal Thoughts:  No  Memory:  WNL  Judgement:  Good  Insight:  Good  Psychomotor Activity:  Normal  Concentration:  Concentration: Good and Attention Span: Good  Recall:  Good  Fund of Knowledge: Good  Language: Good  Assets:  Communication Skills Desire for Improvement Financial Resources/Insurance Housing Intimacy Leisure Time Physical Health Resilience Social Support Talents/Skills Transportation Vocational/Educational  ADL's:  Intact  Cognition:  WNL  Prognosis:  Good   Screenings:  Flowsheet Row ED from 03/21/2024 in [facility] at [facility]  C-SSRS RISK CATEGORY No Risk    Receiving Psychotherapy: No   Treatment Plan/Recommendations:   Plan:  PDMP reviewed  Decrease Abilify  5mg  to 2.5mg  daily - akathisia - helpful for mood and would like to reduce the dose.  Effexor  XR 112.5mg  every morning Trazadone 100mg  at hs for sleep Hydroxyzine  25mg  BID for anxiety  RTC 4 weeks  60 minutes spent dedicated to the care of this patient on the date of this encounter to include pre-visit review of records, ordering of medication, post visit documentation, and face-to-face time with the patient discussing depression. Discussed continuing current medication regimen.  Patient advised to contact office with any questions, adverse effects, or acute worsening in signs  and symptoms.   Alydia Gosser N Cooper Moroney, NP

## 2024-04-01 ENCOUNTER — Telehealth: Payer: Self-pay

## 2024-04-01 NOTE — Telephone Encounter (Signed)
 Patient notified of recommendation.  She asked if she needed another medication in addition to the Effexor . I told her she was a new patient and it probably isn't clear at this point what medications might need to be added or changed. Also told her that you can't always do a cross-taper, that sometimes you have to come off a medication and give it time to clear your system before adding a new medication because too many changes at once don't give you an adequate picture of what works or doesn't work.

## 2024-04-02 ENCOUNTER — Emergency Department (HOSPITAL_BASED_OUTPATIENT_CLINIC_OR_DEPARTMENT_OTHER): Admission: EM | Admit: 2024-04-02 | Discharge: 2024-04-02 | Disposition: A | Discharge status: Home / Self Care

## 2024-04-02 ENCOUNTER — Emergency Department (HOSPITAL_BASED_OUTPATIENT_CLINIC_OR_DEPARTMENT_OTHER)
Admission: EM | Admit: 2024-04-02 | Discharge: 2024-04-02 | Disposition: A | Discharge status: Home / Self Care | Attending: Emergency Medicine | Admitting: Emergency Medicine

## 2024-04-02 ENCOUNTER — Other Ambulatory Visit: Payer: Self-pay

## 2024-04-02 ENCOUNTER — Emergency Department (HOSPITAL_BASED_OUTPATIENT_CLINIC_OR_DEPARTMENT_OTHER)

## 2024-04-02 ENCOUNTER — Encounter (HOSPITAL_BASED_OUTPATIENT_CLINIC_OR_DEPARTMENT_OTHER): Payer: Self-pay

## 2024-04-02 DIAGNOSIS — R110 Nausea: Secondary | ICD-10-CM | POA: Insufficient documentation

## 2024-04-02 DIAGNOSIS — R300 Dysuria: Secondary | ICD-10-CM | POA: Insufficient documentation

## 2024-04-02 DIAGNOSIS — G629 Polyneuropathy, unspecified: Secondary | ICD-10-CM | POA: Insufficient documentation

## 2024-04-02 DIAGNOSIS — R1032 Left lower quadrant pain: Secondary | ICD-10-CM | POA: Diagnosis present

## 2024-04-02 DIAGNOSIS — G479 Sleep disorder, unspecified: Secondary | ICD-10-CM | POA: Diagnosis not present

## 2024-04-02 DIAGNOSIS — R200 Anesthesia of skin: Secondary | ICD-10-CM | POA: Diagnosis present

## 2024-04-02 LAB — BASIC METABOLIC PANEL WITH GFR
Anion gap: 14 (ref 5–15)
BUN: 14 mg/dL (ref 6–20)
CO2: 22 mmol/L (ref 22–32)
Calcium: 10 mg/dL (ref 8.9–10.3)
Chloride: 105 mmol/L (ref 98–111)
Creatinine, Ser: 0.71 mg/dL (ref 0.44–1.00)
GFR, Estimated: >60 mL/min (ref >60)
Glucose, Bld: 110 mg/dL — ABNORMAL HIGH (ref 70–99)
Potassium: 4 mmol/L (ref 3.5–5.1)
Sodium: 141 mmol/L (ref 135–145)

## 2024-04-02 LAB — URINALYSIS, ROUTINE W REFLEX MICROSCOPIC
Bilirubin Urine: NEGATIVE
Glucose, UA: NEGATIVE mg/dL
Hgb urine dipstick: NEGATIVE
Ketones, ur: NEGATIVE mg/dL
Leukocytes,Ua: NEGATIVE
Nitrite: NEGATIVE
Protein, ur: NEGATIVE mg/dL
Specific Gravity, Urine: 1.012 (ref 1.005–1.030)
pH: 5.5 (ref 5.0–8.0)

## 2024-04-02 LAB — CBC
HCT: 41.9 % (ref 36.0–46.0)
Hemoglobin: 14.4 g/dL (ref 12.0–15.0)
MCH: 29.4 pg (ref 26.0–34.0)
MCHC: 34.4 g/dL (ref 30.0–36.0)
MCV: 85.7 fL (ref 80.0–100.0)
Platelets: 264 10*3/uL (ref 150–400)
RBC: 4.89 MIL/uL (ref 3.87–5.11)
RDW: 11.9 % (ref 11.5–15.5)
WBC: 5.9 10*3/uL (ref 4.0–10.5)
nRBC: 0 % (ref 0.0–0.2)

## 2024-04-02 MED ORDER — ONDANSETRON 4 MG PO TBDP: 4.0000 mg | ORAL_TABLET | Freq: Three times a day (TID) | ORAL | 0 refills | Status: DC | PRN

## 2024-04-02 MED ORDER — IOHEXOL 300 MG/ML  SOLN
100.0000 mL | Freq: Once | INTRAMUSCULAR | Status: AC | PRN
  Administered 2024-04-02: 100 mL via INTRAVENOUS

## 2024-04-02 MED ORDER — KETOROLAC TROMETHAMINE 15 MG/ML IJ SOLN
15.0000 mg | Freq: Once | INTRAMUSCULAR | Status: AC
  Administered 2024-04-02: 15 mg via INTRAVENOUS
  Filled 2024-04-02: qty 1

## 2024-04-02 NOTE — Discharge Instructions (Addendum)
 Please follow-up with your primary doctor.  Return immediately for fevers, chills, chest pain, shortness of breath, worsening abdominal pain, inability to eat or drink due to nausea vomiting or any new or worsening symptoms that are concerning to you.  As discussed you may take over-the-counter MiraLAX for constipation.  We are prescribing nausea medications that you may use as needed for nausea.

## 2024-04-02 NOTE — ED Triage Notes (Signed)
 Pt POV reporting possible withdrawal from Ambilify, began tapering off Wednesday, now feeling tingling sensation in hands a restlessness. Recently admitted 6/9 for medication management.

## 2024-04-02 NOTE — ED Notes (Signed)
 Patient transported to CT

## 2024-04-02 NOTE — ED Triage Notes (Signed)
 Patient to ED worsening pelvic pain and burning for the past couple weeks but worse pain x 1 hour. No fever or urinary changes.

## 2024-04-02 NOTE — ED Notes (Signed)
Reviewed discharge instructions, medications, and home care with pt. Pt verbalized understanding and had no further questions. Pt exited ED without complications.

## 2024-04-02 NOTE — ED Provider Notes (Signed)
 [place] EMERGENCY DEPARTMENT AT [facility] Provider Note   CSN: [id number] Arrival date & time: 04/02/24  0110     History Chief Complaint  Patient presents with   Withdrawal    HPI Brenda Martinez is a 58 y.o. female presenting for numbness and tingling in extremities. Recent medication changes. Admitted recently for psychiatric symptoms. Psychiatry OP wanted her off Abilify . Effexor  is still ongoing. Patient states the anxiety symptoms are improving.  Having some difficulty urinating.  Pins and needles in the fingers. Going [facility] with [facility].  Quite discomforting.  Appetite suppression.    Patient's recorded medical, surgical, social, medication list and allergies were reviewed in the Snapshot window as part of the initial history.   Review of Systems   Review of Systems  Constitutional:  Negative for chills and fever.  HENT:  Negative for ear pain and sore throat.   Eyes:  Negative for pain and visual disturbance.  Respiratory:  Negative for cough and shortness of breath.   Cardiovascular:  Negative for chest pain and palpitations.  Gastrointestinal:  Negative for abdominal pain and vomiting.  Genitourinary:  Negative for dysuria and hematuria.  Musculoskeletal:  Negative for arthralgias and back pain.  Skin:  Negative for color change and rash.  Neurological:  Negative for seizures and syncope.  Psychiatric/Behavioral:  Positive for agitation and sleep disturbance. The patient is nervous/anxious.   All other systems reviewed and are negative.   Physical Exam Updated Vital Signs BP 136/82   Pulse 82   Temp 97.7 F (36.5 C)   Resp 18   Ht 5' 9 (1.753 m)   Wt 70.3 kg   LMP 01/27/2012   SpO2 99%   BMI 22.89 kg/m  Physical Exam Vitals and nursing note reviewed.  Constitutional:      General: She is not in acute distress.    Appearance: She is well-developed.  HENT:     Head: Normocephalic and atraumatic.   Eyes:      Conjunctiva/sclera: Conjunctivae normal.    Cardiovascular:     Rate and Rhythm: Normal rate and regular rhythm.     Heart sounds: No murmur heard. Pulmonary:     Effort: Pulmonary effort is normal. No respiratory distress.     Breath sounds: Normal breath sounds.  Abdominal:     General: There is no distension.     Palpations: Abdomen is soft.     Tenderness: There is no abdominal tenderness. There is no right CVA tenderness or left CVA tenderness.   Musculoskeletal:        General: No swelling or tenderness. Normal range of motion.     Cervical back: Neck supple.   Skin:    General: Skin is warm and dry.   Neurological:     General: No focal deficit present.     Mental Status: She is alert and oriented to person, place, and time. Mental status is at baseline.     Cranial Nerves: No cranial nerve deficit.      ED Course/ Medical Decision Making/ A&P    Procedures Procedures   Medications Ordered in ED Medications - No data to display  Medical Decision Making:   [name] is a 58 y.o. female who presented to the ED today with a myriad of symptoms detailed above.    Patient placed on continuous vitals and telemetry monitoring while in ED which was reviewed periodically.  Complete initial physical exam performed, notably the patient  was grossly neurologically intact with no focal deficits.  Hemodynamically stable no acute distress.    Reviewed and confirmed nursing documentation for past medical history, family history, social history.    Initial Assessment:   Patient symptoms are very difficult to pinpoint.  She is having sleep disturbance, tingling sensation, fatigue, restlessness. Overall her history of present illness physicals and findings are most consistent with multiple psychotropic medication changes including stopping Abilify , stopping hydroxyzine , recent admission to the hospital and discharged from psychiatric ward. Considered CVA but this is grossly  inconsistent with the presentation, she is not having any motor symptoms consistent with Guillain-Barr. Shared medical decision making.  EKG did shows no evidence of serotonin syndrome, lab work shows no evidence of metabolic crisis and urinalysis shows no evidence of UTI. She was initially reporting some decreased urination so she self discontinued her hydroxyzine .  I favor that this might be why she is having some difficulty sleeping after sudden discontinuation of this medication after prolonged use. Recommended slower taper off of this medication after she finishes tapering off of Abilify . She is in agreement.  Resting comfortably after 4 and half hours of observation, feels comfortable with discharge.  Disposition:  I have considered need for hospitalization, however, considering all of the above, I believe this patient is stable for discharge at this time.  Patient/family educated about specific return precautions for given chief complaint and symptoms.  Patient/family educated about follow-up with PCP.     Patient/family expressed understanding of return precautions and need for follow-up. Patient spoken to regarding all imaging and laboratory results and appropriate follow up for these results. All education provided in verbal form with additional information in written form. Time was allowed for answering of patient questions. Patient discharged.    Emergency Department Medication Summary:   Medications - No data to display      Clinical Impression:  1. Sleep disturbance   2. Neuropathy      Discharge   Final Clinical Impression(s) / ED Diagnoses Final diagnoses:  Sleep disturbance  Neuropathy    Rx / DC Orders ED Discharge Orders     None         Jerral Meth, MD 04/02/24 [phone]

## 2024-04-02 NOTE — ED Provider Notes (Signed)
 [place] EMERGENCY DEPARTMENT AT [facility] Provider Note   CSN: [id number] Arrival date & time: 04/02/24  1427     Patient presents with: Abdominal Pain   Brenda Martinez is a 58 y.o. female.   58 year old female present emergency department for left lower quadrant abdominal pain.  Reports that she has had some burning sensation to the left lower quadrant for the past couple weeks, symptoms worsened this afternoon with intense pain.  Associated nausea.  No vomiting.  Has had some constipation for the past several days, but last bowel movement this morning small amount.  No fevers no chills.   Abdominal Pain      Prior to Admission medications   Medication Sig Start Date End Date Taking? Authorizing Provider  ondansetron (ZOFRAN-ODT) 4 MG disintegrating tablet Take 1 tablet (4 mg total) by mouth every 8 (eight) hours as needed for nausea or vomiting. 04/02/24  Yes Neysa Caron PARAS, DO  ARIPiprazole  (ABILIFY ) 5 MG tablet Take 1 tablet (5 mg total) by mouth daily. 03/26/24   Delsie Lynwood Morene Lavone, MD  hydrOXYzine  (ATARAX ) 25 MG tablet Take 2 tablets (50 mg total) by mouth every 8 (eight) hours as needed for anxiety. Take 1-2 tablets by mouth up to every 8 (eight) hours as needed for anxiety. 03/26/24   Delsie Lynwood Morene Lavone, MD  LORazepam  (ATIVAN ) 0.5 MG tablet Take 1 tablet (0.5 mg total) by mouth every 8 (eight) hours as needed for anxiety. 03/26/24   Delsie Lynwood Morene Lavone, MD  rosuvastatin (CRESTOR) 5 MG tablet Take 5 mg by mouth daily. 12/20/23   [provider]  traZODone  (DESYREL ) 100 MG tablet Take 1 tablet (100 mg total) by mouth at bedtime. 03/26/24   Delsie Lynwood Morene Lavone, MD  venlafaxine  XR (EFFEXOR -XR) 37.5 MG 24 hr capsule Take 3 capsules (112.5 mg total) by mouth daily with breakfast. 03/26/24   Delsie Lynwood Morene Lavone, MD    Allergies: Patient has no known allergies.    Review of Systems  Gastrointestinal:   Positive for abdominal pain.    Updated Vital Signs BP 139/75   Pulse 80   Temp 98.6 F (37 C) (Oral)   Resp 18   LMP 01/27/2012   SpO2 98%   Physical Exam Vitals and nursing note reviewed.  Constitutional:      General: She is not in acute distress.    Appearance: She is not toxic-appearing.  HENT:     Head: Normocephalic.   Cardiovascular:     Rate and Rhythm: Normal rate and regular rhythm.  Pulmonary:     Effort: Pulmonary effort is normal.     Breath sounds: Normal breath sounds.  Abdominal:     General: Abdomen is flat.     Palpations: Abdomen is soft.     Tenderness: There is abdominal tenderness in the left lower quadrant. There is no guarding or rebound.   Skin:    General: Skin is warm and dry.   Neurological:     General: No focal deficit present.     Mental Status: She is alert.   Psychiatric:        Mood and Affect: Mood normal.     (all labs ordered are listed, but only abnormal results are displayed) Labs Reviewed - No data to display  EKG: None  Radiology: CT ABDOMEN PELVIS W CONTRAST Result Date: 04/02/2024 CLINICAL DATA:  Left-sided abdominal pain EXAM: CT ABDOMEN AND PELVIS WITH CONTRAST TECHNIQUE: Multidetector CT imaging of the  abdomen and pelvis was performed using the standard protocol following bolus administration of intravenous contrast. RADIATION DOSE REDUCTION: This exam was performed according to the departmental dose-optimization program which includes automated exposure control, adjustment of the mA and/or kV according to patient size and/or use of iterative reconstruction technique. CONTRAST:  100mL OMNIPAQUE IOHEXOL 300 MG/ML  SOLN COMPARISON:  None Available. FINDINGS: Lower chest: No acute abnormality. Hepatobiliary: No focal liver abnormality is seen. No gallstones, gallbladder wall thickening, or biliary dilatation. Pancreas: Unremarkable. No pancreatic ductal dilatation or surrounding inflammatory changes. Spleen: Normal in size  without focal abnormality. Adrenals/Urinary Tract: Adrenal glands are within normal limits. Kidneys demonstrate a normal enhancement pattern bilaterally. No renal calculi or obstructive changes are seen. The ureters are within normal limits. The bladder is decompressed. Stomach/Bowel: No obstructive or inflammatory changes of the colon are noted. Fecal material is noted throughout the colon. No obstructive changes are seen. Appendix is within normal limits. No inflammatory changes are seen. Small bowel and stomach are unremarkable. Vascular/Lymphatic: Aortic atherosclerosis. No enlarged abdominal or pelvic lymph nodes. Reproductive: Uterus and bilateral adnexa are unremarkable. Other: No abdominal wall hernia or abnormality. No abdominopelvic ascites. Musculoskeletal: No acute or significant osseous findings. IMPRESSION: No acute abnormality noted in the abdomen and pelvis. Electronically Signed   By: Oneil Devonshire M.D.   On: 04/02/2024 18:33     Procedures   Medications Ordered in the ED  ketorolac  (TORADOL ) 15 MG/ML injection 15 mg (15 mg Intravenous Given 04/02/24 1709)  iohexol (OMNIPAQUE) 300 MG/ML solution 100 mL (100 mLs Intravenous Contrast Given 04/02/24 1731)    Clinical Course as of 04/02/24 1948  Sat Apr 02, 2024  1609 Per chart review was seen last night discharged.  Labs at that time with no metabolic derangements.  Normal kidney function.  No leukocytosis or anemia.  UA not consistent with UTI.  No hematuria to suggest stone.  Patient complaining of left lower quadrant pain that acutely worsened this afternoon with associated nausea, but no vomiting.  Will get CT scan to evaluate for diverticulitis also notes not been having good bowel movements evaluate for obstructive process as well given her nausea.  Will give IV Toradol  for pain control.  Offered nausea medications, patient declined. [TY]  1756 CT ABDOMEN PELVIS W CONTRAST Do not appreciate free air on my independent review of images.   Does appear to have significant stool burden [TY]  1840 CT ABDOMEN PELVIS W CONTRAST MPRESSION: No acute abnormality noted in the abdomen and pelvis.   Electronically Signed   By: Oneil Devonshire M.D.   On: 04/02/2024 18:33   [TY]  1845 Continues to have a benign abdominal exam.  Will discharge at this time to follow-up with primary doctor.  Discussed bowel regimen with patient with MiraLAX as CT scan did have moderate stool burden on my review [TY]    Clinical Course User Index [TY] Neysa Caron PARAS, DO                                 Medical Decision Making 58 year old female presents emergency department for left lower quadrant abdominal pain.  Afebrile nontachycardic hemodynamically stable.  See ED course for initial impressions and workup.  Final MDM; CT scan negative.  Given Toradol  for pain with some minor improvement.  Serial abdominal exams with soft nonsurgical abdomin.  Suspect symptoms multifactorial and may be partly due to constipation.  Also agree with  Dr. Glendon assessment that could be secondary to psychiatric medications.  However, given stable vitals, repeat exams, reassuring lab work and negative CT scan that patient can be safely discharged to follow-up outpatient.  Amount and/or Complexity of Data Reviewed Independent Historian:     Details: Spouse notes that they are worried about intra-abdominal pathology as symptoms intensified today.  Give Tylenol  at 2 PM External Data Reviewed:     Details: Does not appear to have CT scans in the recent past, and spouse notes that she has had hysterectomy with mesh placement. Labs:     Details: Consider repeat labs, but was seen last night/early this morning with reassuring labs per my chart review Radiology: ordered and independent interpretation performed. Decision-making details documented in ED Course.  Risk Prescription drug management. Decision regarding hospitalization. Diagnosis or treatment significantly  limited by social determinants of health.       Final diagnoses:  Left lower quadrant abdominal pain    ED Discharge Orders          Ordered    ondansetron (ZOFRAN-ODT) 4 MG disintegrating tablet  Every 8 hours PRN        04/02/24 1846               Neysa Caron PARAS, DO 04/02/24 1949

## 2024-04-05 ENCOUNTER — Telehealth: Payer: Self-pay

## 2024-04-05 NOTE — Telephone Encounter (Signed)
 Please schedule patient with first available appt. Has multiple concerns that she needs an appt to discuss.

## 2024-04-05 NOTE — Telephone Encounter (Signed)
 Pt stopped Abilify  over the weekend and is reporting she is worse. She went to the ED twice on 6/21; sleep disturbance and abdominal pain. She reports she has trouble getting to sleep and staying asleep, is taking trazodone  and hydroxyzine . It was thought that sleep issues could be contributed to multiple med changes from being in the hospital and seeing new provider. She reports seeing her PCP - Dr. [name]. She said she wanted to try to contact you to discuss patient. Pt said PCP had questions about the possibility of her being bipolar and if TMS might be an option.

## 2024-04-06 ENCOUNTER — Telehealth: Payer: Self-pay

## 2024-04-06 ENCOUNTER — Telehealth: Payer: Self-pay | Admitting: Adult Health

## 2024-04-06 NOTE — Telephone Encounter (Signed)
 Dr. Waylan faxed patient's last note. Can be accessed at [email or web address] Reference code [id number]

## 2024-04-06 NOTE — Telephone Encounter (Signed)
 Noted

## 2024-04-06 NOTE — Telephone Encounter (Signed)
 Received med records from Dr. Vincente via fax. Advised provider and placed in providers box

## 2024-04-06 NOTE — Telephone Encounter (Signed)
 Pt called in wanting to know if she can take the Lorazepam  and Trazodone  together. Advised pt that Tillman is requesting to speak about meds at next appt. 7/1 yet pt still had questions about what to do about not being able to go back to sleep if she wakes up in the middle of the night.

## 2024-04-06 NOTE — Telephone Encounter (Signed)
 Tillman said it was okay for pt to take trazodone  and lorazepam  if she wakes up in the middle of the night. Pt has lorazepam  from Dr. Vincente. She asks about taking it during the day. She needs to have appt with Tillman to discuss her multiple complaints. Patient is aware.

## 2024-04-07 ENCOUNTER — Telehealth: Admitting: Adult Health

## 2024-04-08 ENCOUNTER — Telehealth: Payer: Self-pay | Admitting: Adult Health

## 2024-04-08 NOTE — Telephone Encounter (Signed)
 Pt called again today stating she needs to talk to someone. Having a hard time. Apt 7/1 and PCP Dr. Waylan had left message to contact via personal phone. Earlier apt not available. Please contact Pt @ [phone]

## 2024-04-08 NOTE — Telephone Encounter (Signed)
 Pt said she wanted to establish with us  because she wanted someone within the [facility] system. She has not sought counseling yet, said she needs to get meds under control first. She reports being restless, doesn't feel like she can can sit still and reports not sleeping.   The note said to decrease Abilify  from 5 mg to 2.5 mg. On 6/20 she was told to stop Abilify  to see if sx of restlessness and GI sx resolved. She reports they have not.  Told her that we now have records from Dr. Jonothan office and Dr. Thamas last note, but need to see her for an appt to make any further adjustments. She does have an appt on 7/1.

## 2024-04-08 NOTE — Telephone Encounter (Signed)
 Pt was prescribed lorazepam  0.5 mg TID by Dr. Vincente. Can she take this, at least until she can be seen?

## 2024-04-11 ENCOUNTER — Telehealth: Payer: Self-pay | Admitting: Adult Health

## 2024-04-11 NOTE — Telephone Encounter (Signed)
 Pt called to report PCP will follow meds going forward. Canc apt 7/1.

## 2024-04-12 ENCOUNTER — Telehealth: Payer: Self-pay | Admitting: Adult Health

## 2024-04-18 ENCOUNTER — Other Ambulatory Visit: Payer: Self-pay

## 2024-04-18 ENCOUNTER — Encounter (HOSPITAL_COMMUNITY): Payer: Self-pay

## 2024-04-18 ENCOUNTER — Emergency Department (HOSPITAL_COMMUNITY)

## 2024-04-18 ENCOUNTER — Emergency Department (HOSPITAL_COMMUNITY)
Admission: EM | Admit: 2024-04-18 | Discharge: 2024-04-18 | Disposition: A | Discharge status: Home / Self Care | Attending: Emergency Medicine | Admitting: Emergency Medicine

## 2024-04-18 DIAGNOSIS — R55 Syncope and collapse: Secondary | ICD-10-CM | POA: Diagnosis present

## 2024-04-18 DIAGNOSIS — R1031 Right lower quadrant pain: Secondary | ICD-10-CM | POA: Diagnosis not present

## 2024-04-18 LAB — CBC
HCT: 41.5 % (ref 36.0–46.0)
Hemoglobin: 14 g/dL (ref 12.0–15.0)
MCH: 29.6 pg (ref 26.0–34.0)
MCHC: 33.7 g/dL (ref 30.0–36.0)
MCV: 87.7 fL (ref 80.0–100.0)
Platelets: 210 K/uL (ref 150–400)
RBC: 4.73 MIL/uL (ref 3.87–5.11)
RDW: 12.4 % (ref 11.5–15.5)
WBC: 4.7 K/uL (ref 4.0–10.5)
nRBC: 0 % (ref 0.0–0.2)

## 2024-04-18 LAB — COMPREHENSIVE METABOLIC PANEL WITH GFR
ALT: 10 U/L (ref 0–44)
AST: 22 U/L (ref 15–41)
Albumin: 4 g/dL (ref 3.5–5.0)
Alkaline Phosphatase: 46 U/L (ref 38–126)
Anion gap: 14 (ref 5–15)
BUN: 9 mg/dL (ref 6–20)
CO2: 21 mmol/L — ABNORMAL LOW (ref 22–32)
Calcium: 9.4 mg/dL (ref 8.9–10.3)
Chloride: 105 mmol/L (ref 98–111)
Creatinine, Ser: 0.61 mg/dL (ref 0.44–1.00)
GFR, Estimated: >60 mL/min (ref >60)
Glucose, Bld: 90 mg/dL (ref 70–99)
Potassium: 4.4 mmol/L (ref 3.5–5.1)
Sodium: 140 mmol/L (ref 135–145)
Total Bilirubin: 0.9 mg/dL (ref 0.0–1.2)
Total Protein: 6.2 g/dL — ABNORMAL LOW (ref 6.5–8.1)

## 2024-04-18 LAB — URINALYSIS, ROUTINE W REFLEX MICROSCOPIC
Bilirubin Urine: NEGATIVE
Glucose, UA: NEGATIVE mg/dL
Hgb urine dipstick: NEGATIVE
Ketones, ur: 5 mg/dL — AB
Leukocytes,Ua: NEGATIVE
Nitrite: NEGATIVE
Protein, ur: NEGATIVE mg/dL
Specific Gravity, Urine: 1.009 (ref 1.005–1.030)
pH: 6 (ref 5.0–8.0)

## 2024-04-18 MED ORDER — IOHEXOL 350 MG/ML SOLN
75.0000 mL | Freq: Once | INTRAVENOUS | Status: AC | PRN
  Administered 2024-04-18: 75 mL via INTRAVENOUS

## 2024-04-18 MED ORDER — FENTANYL CITRATE PF 50 MCG/ML IJ SOSY
25.0000 ug | PREFILLED_SYRINGE | Freq: Once | INTRAMUSCULAR | Status: AC
  Administered 2024-04-18: 25 ug via INTRAVENOUS
  Filled 2024-04-18: qty 1

## 2024-04-18 MED ORDER — LACTATED RINGERS IV BOLUS: 1000.0000 mL | Freq: Once | INTRAVENOUS | Status: DC

## 2024-04-18 NOTE — Discharge Instructions (Signed)
 Please drink plenty of fluids as discussed.  Follow-up with your primary care doctor for possible referral to GI and pelvic ultrasound You may use ibuprofen 400 to 600 mg for pain.  Please limit this to 3 times a day and no longer than 3 days at once. Please return if you are having any new or worsening symptoms

## 2024-04-18 NOTE — ED Triage Notes (Signed)
 PT BIB [facility] from home with LLQ abdominal pain with near syncopal episode, sat self down on floor of kitchen. PT endorses she lost 20lbs last month with poor urine output. PT states she started taking risperidone thurs. Hx of anxiety and abdominal pain in past month. Aox4. Received . EMS VS: BP 115/80 CBG 96 HR 94 Spo2 98% on RA

## 2024-04-18 NOTE — ED Provider Notes (Signed)
 [place] EMERGENCY DEPARTMENT AT [facility] Provider Note   CSN: [id number] Arrival date & time: 04/18/24  9276     Patient presents with: Abdominal Pain   Brenda Martinez is a 58 y.o. female.   HPI 58 year old female presents today complaining of anxiety, depression, lightheadedness, syncope, and abdominal pain.  She states that she passed out in her kitchen this morning.  She felt lightheaded and eased herself to the floor but did not lose consciousness.  She denies hitting her head or having headache, head injury, or other injuries.  She reports that she has had abdominal pain for several months and been seen for this.  She has had weight loss secondary to this.  She has also had problems with generalized anxiety disorder and depression.  She reports that she was hospitalized at behavioral [facility] 6 9 through 6 14.  She does not feel that her depression has gotten any better although she is taking her medications.  She denies any suicidality or psychotic features at this time.  She reports that her weight loss is due to her abdominal discomfort although is not having direct vomiting.    Prior to Admission medications   Medication Sig Start Date End Date Taking? Authorizing Provider  acetaminophen  (TYLENOL ) 500 MG tablet Take 500-1,000 mg by mouth every 6 (six) hours as needed for moderate pain (pain score 4-6) or headache.   Yes [provider]  LORazepam  (ATIVAN ) 1 MG tablet Take 1 mg by mouth 3 (three) times daily.   Yes [provider]  Multiple Vitamins-Minerals (MULTIVITAMIN WOMEN 50+) TABS Take 1 tablet by mouth daily.   Yes [provider]  ondansetron  (ZOFRAN -ODT) 4 MG disintegrating tablet Take 1 tablet (4 mg total) by mouth every 8 (eight) hours as needed for nausea or vomiting. 04/02/24  Yes Neysa Caron PARAS, DO  risperiDONE (RISPERDAL) 1 MG tablet Take 1 mg by mouth See admin instructions. Take 1 tablet (1mg ) by mouth at bedtime. May take an  additional tablet later in the night if still awake.   Yes [provider]  rosuvastatin (CRESTOR) 5 MG tablet Take 5 mg by mouth daily. 12/20/23  Yes [provider]  sertraline (ZOLOFT) 50 MG tablet Take 100 mg by mouth daily.   Yes [provider]  venlafaxine  XR (EFFEXOR -XR) 37.5 MG 24 hr capsule Take 3 capsules (112.5 mg total) by mouth daily with breakfast. Patient taking differently: Take 37.5 mg by mouth daily. 03/26/24  Yes Delsie Lynwood Morene Lavone, MD  ARIPiprazole  (ABILIFY ) 5 MG tablet Take 1 tablet (5 mg total) by mouth daily. Patient not taking: Reported on 04/18/2024 03/26/24   Delsie Lynwood Morene Lavone, MD  hydrOXYzine  (ATARAX ) 25 MG tablet Take 2 tablets (50 mg total) by mouth every 8 (eight) hours as needed for anxiety. Take 1-2 tablets by mouth up to every 8 (eight) hours as needed for anxiety. Patient not taking: Reported on 04/18/2024 03/26/24   Delsie Lynwood Morene Lavone, MD  traZODone  (DESYREL ) 100 MG tablet Take 1 tablet (100 mg total) by mouth at bedtime. Patient not taking: Reported on 04/18/2024 03/26/24   Delsie Lynwood Morene Lavone, MD    Allergies: Patient has no known allergies.    Review of Systems  Updated Vital Signs BP 121/77   Pulse 91   Temp 97.6 F (36.4 C) (Oral)   Resp 20   Ht 1.753 m (5' 9)   Wt 65.8 kg   LMP 01/27/2012   SpO2 98%  BMI 21.41 kg/m   Physical Exam Vitals reviewed.  Constitutional:      Appearance: She is well-developed.  HENT:     Head: Normocephalic.     Mouth/Throat:     Mouth: Mucous membranes are moist.  Eyes:     Extraocular Movements: Extraocular movements intact.  Cardiovascular:     Rate and Rhythm: Normal rate and regular rhythm.     Heart sounds: Normal heart sounds.  Pulmonary:     Effort: Pulmonary effort is normal.     Breath sounds: Normal breath sounds.  Abdominal:     General: Abdomen is flat. Bowel sounds are normal.     Palpations: Abdomen is soft.      Tenderness: There is abdominal tenderness in the right lower quadrant. There is no guarding or rebound.  Skin:    General: Skin is warm.     Capillary Refill: Capillary refill takes less than 2 seconds.  Neurological:     General: No focal deficit present.     Mental Status: She is alert.  Psychiatric:        Mood and Affect: Mood normal.     (all labs ordered are listed, but only abnormal results are displayed) Labs Reviewed  COMPREHENSIVE METABOLIC PANEL WITH GFR - Abnormal; Notable for the following components:      Result Value   CO2 21 (*)    Total Protein 6.2 (*)    All other components within normal limits  URINALYSIS, ROUTINE W REFLEX MICROSCOPIC - Abnormal; Notable for the following components:   Color, Urine STRAW (*)    Ketones, ur 5 (*)    All other components within normal limits  CBC    EKG: EKG Interpretation Date/Time:  Monday April 18 2024 07:35:45 EDT Ventricular Rate:  81 PR Interval:  119 QRS Duration:  89 QT Interval:  382 QTC Calculation: 444 R Axis:   65  Text Interpretation: Sinus rhythm Borderline short PR interval Confirmed by Levander Houston [id number] on 04/18/2024 8:22:44 AM  Radiology: CT ABDOMEN PELVIS W CONTRAST Result Date: 04/18/2024 CLINICAL DATA:  rlq pain. EXAM: CT ABDOMEN AND PELVIS WITH CONTRAST TECHNIQUE: Multidetector CT imaging of the abdomen and pelvis was performed using the standard protocol following bolus administration of intravenous contrast. RADIATION DOSE REDUCTION: This exam was performed according to the departmental dose-optimization program which includes automated exposure control, adjustment of the mA and/or kV according to patient size and/or use of iterative reconstruction technique. CONTRAST:  75mL OMNIPAQUE  IOHEXOL  350 MG/ML SOLN COMPARISON:  CT scan abdomen and pelvis from 04/02/2024. FINDINGS: Lower chest: There are dependent changes in the visualized lung bases. No overt consolidation. No pleural effusion. The heart is  normal in size. No pericardial effusion. Hepatobiliary: The liver is normal in size. Non-cirrhotic configuration. No suspicious mass. No intrahepatic or extrahepatic bile duct dilation. No calcified gallstones. Normal gallbladder wall thickness. No pericholecystic inflammatory changes. Pancreas: Unremarkable. No pancreatic ductal dilatation or surrounding inflammatory changes. Spleen: Within normal limits. No focal lesion. Adrenals/Urinary Tract: Adrenal glands are unremarkable. No suspicious renal mass. No hydronephrosis. No renal or ureteric calculi. Unremarkable urinary bladder. Stomach/Bowel: No disproportionate dilation of the small or large bowel loops. No evidence of abnormal bowel wall thickening or inflammatory changes. The appendix was not visualized; however there is no acute inflammatory process in the right lower quadrant. Vascular/Lymphatic: No ascites or pneumoperitoneum. No abdominal or pelvic lymphadenopathy, by size criteria. No aneurysmal dilation of the major abdominal arteries. There are mild peripheral atherosclerotic vascular  calcifications of the aorta and its major branches. Reproductive: The uterus is unremarkable. No large adnexal mass. Other: There is a tiny fat containing umbilical hernia. Transverse C-section scar noted. The soft tissues and abdominal wall are otherwise unremarkable. Musculoskeletal: No suspicious osseous lesions. There are mild multilevel degenerative changes in the visualized spine. IMPRESSION: 1. No acute inflammatory process identified within the abdomen or pelvis. 2. Multiple other nonacute observations, as described above. Aortic Atherosclerosis (ICD10-I70.0). Electronically Signed   By: Ree Molt M.D.   On: 04/18/2024 11:50   DG Chest Port 1 View Result Date: 04/18/2024 CLINICAL DATA:  Syncope. EXAM: PORTABLE CHEST 1 VIEW COMPARISON:  01/27/2016. FINDINGS: The heart size and mediastinal contours are within normal limits. No focal consolidation, pleural  effusion, or pneumothorax. No acute osseous abnormality. IMPRESSION: No acute cardiopulmonary findings. Electronically Signed   By: Harrietta Sherry M.D.   On: 04/18/2024 09:05     Procedures   Medications Ordered in the ED  lactated ringers  bolus 1,000 mL (has no administration in time range)  fentaNYL  (SUBLIMAZE ) injection 25 mcg (25 mcg Intravenous Given 04/18/24 1031)  iohexol  (OMNIPAQUE ) 350 MG/ML injection 75 mL (75 mLs Intravenous Contrast Given 04/18/24 1126)    Clinical Course as of 04/18/24 1231  Mon Apr 18, 2024  1205 CT abdomen and pelvis obtained with no evidence of acute abnormality to identify pain in her right lower quadrant although the appendix was not visualized there is no acute inflammatory process in the right lower quadrant.  There is a tiny fat-containing umbilical hernia [DR]    Clinical Course User Index [DR] Levander Houston, MD                                 Medical Decision Making Amount and/or Complexity of Data Reviewed Labs: ordered. Radiology: ordered.  Risk Prescription drug management.   58 year old female presents today complaining of syncope, right lower quadrant pain, recent depression and anxiety being treated with multiple medications. 1 syncope patient with normal EKG here.  She has decreased p.o. intake.  She is slightly orthostatic.  She has just started sertraline which could contribute to orthostasis.  She was evaluated here with with CBC, complete metabolic panel that does not show any acute electrolyte abnormalities although she does have decreased protein consistent with her report of weight loss and decreased p.o. intake.  I suspect that the syncope was secondary to the combination of the sertraline and some orthostasis.  She is able to take p.o. and will take p.o. at home. 2 right lower quadrant pain patient evaluated here with CT scan.  No obvious abnormality noted.  The pain is lower in her abdomen and she does have ovaries and uterus.   She is advised to follow-up with her primary care doctor for possible outpatient ultrasound.  Did not think that is indicated here with normal CT scan but could be etiology of some of her pain.  We discussed controlling pain with ibuprofen versus acetaminophen .  Additionally patient has had significant weight loss and in combination with this pain will refer to GI. 3 anxiety and depression.  She thinks some of her abdominal pain and discomfort may be secondary to anxiety.  She has had multiple medication changes due to recent worsening of her anxiety and depression.  She is advised regarding follow-up with her psychiatrist and voices understanding. I have discussed the above with the patient and her husband in detail  at the bedside.  We have discussed referral to GI, oral hydration, follow-up with primary care for probable pelvic ultrasound and return precautions and they voiced understanding.     Final diagnoses:  Syncope, unspecified syncope type  Right lower quadrant abdominal pain    ED Discharge Orders     None          Levander Houston, MD 04/18/24 1231

## 2024-05-06 ENCOUNTER — Emergency Department (HOSPITAL_COMMUNITY)
Admission: EM | Admit: 2024-05-06 | Discharge: 2024-05-07 | Disposition: A | Discharge status: Home / Self Care | Attending: Emergency Medicine | Admitting: Emergency Medicine

## 2024-05-06 ENCOUNTER — Emergency Department (HOSPITAL_COMMUNITY)

## 2024-05-06 ENCOUNTER — Encounter (HOSPITAL_COMMUNITY): Payer: Self-pay

## 2024-05-06 DIAGNOSIS — R7989 Other specified abnormal findings of blood chemistry: Secondary | ICD-10-CM

## 2024-05-06 DIAGNOSIS — T50901A Poisoning by unspecified drugs, medicaments and biological substances, accidental (unintentional), initial encounter: Secondary | ICD-10-CM

## 2024-05-06 DIAGNOSIS — R891 Abnormal level of hormones in specimens from other organs, systems and tissues: Secondary | ICD-10-CM | POA: Diagnosis not present

## 2024-05-06 DIAGNOSIS — T450X1A Poisoning by antiallergic and antiemetic drugs, accidental (unintentional), initial encounter: Secondary | ICD-10-CM | POA: Insufficient documentation

## 2024-05-06 DIAGNOSIS — T43011A Poisoning by tricyclic antidepressants, accidental (unintentional), initial encounter: Secondary | ICD-10-CM | POA: Insufficient documentation

## 2024-05-06 DIAGNOSIS — T43211A Poisoning by selective serotonin and norepinephrine reuptake inhibitors, accidental (unintentional), initial encounter: Secondary | ICD-10-CM | POA: Insufficient documentation

## 2024-05-06 DIAGNOSIS — T43591A Poisoning by other antipsychotics and neuroleptics, accidental (unintentional), initial encounter: Secondary | ICD-10-CM | POA: Insufficient documentation

## 2024-05-06 LAB — URINALYSIS, ROUTINE W REFLEX MICROSCOPIC
Bilirubin Urine: NEGATIVE
Glucose, UA: NEGATIVE mg/dL
Hgb urine dipstick: NEGATIVE
Ketones, ur: NEGATIVE mg/dL
Leukocytes,Ua: NEGATIVE
Nitrite: NEGATIVE
Protein, ur: NEGATIVE mg/dL
Specific Gravity, Urine: 1.003 — ABNORMAL LOW (ref 1.005–1.030)
pH: 6 (ref 5.0–8.0)

## 2024-05-06 LAB — COMPREHENSIVE METABOLIC PANEL WITH GFR
ALT: 10 U/L (ref 0–44)
AST: 13 U/L — ABNORMAL LOW (ref 15–41)
Albumin: 3.9 g/dL (ref 3.5–5.0)
Alkaline Phosphatase: 71 U/L (ref 38–126)
Anion gap: 11 (ref 5–15)
BUN: 9 mg/dL (ref 6–20)
CO2: 25 mmol/L (ref 22–32)
Calcium: 9.8 mg/dL (ref 8.9–10.3)
Chloride: 105 mmol/L (ref 98–111)
Creatinine, Ser: 0.69 mg/dL (ref 0.44–1.00)
GFR, Estimated: >60 mL/min (ref >60)
Glucose, Bld: 97 mg/dL (ref 70–99)
Potassium: 3.7 mmol/L (ref 3.5–5.1)
Sodium: 141 mmol/L (ref 135–145)
Total Bilirubin: 0.6 mg/dL (ref 0.0–1.2)
Total Protein: 6.7 g/dL (ref 6.5–8.1)

## 2024-05-06 LAB — ETHANOL: Alcohol, Ethyl (B): <15 mg/dL (ref <15)

## 2024-05-06 LAB — RAPID URINE DRUG SCREEN, HOSP PERFORMED
Amphetamines: NOT DETECTED
Barbiturates: NOT DETECTED
Benzodiazepines: POSITIVE — AB
Cocaine: NOT DETECTED
Opiates: NOT DETECTED
Tetrahydrocannabinol: NOT DETECTED

## 2024-05-06 LAB — HCG, QUANTITATIVE, PREGNANCY: hCG, Beta Chain, Quant, S: 7 m[IU]/mL — ABNORMAL HIGH (ref <5)

## 2024-05-06 LAB — ACETAMINOPHEN LEVEL: Acetaminophen (Tylenol), Serum: <10 ug/mL — ABNORMAL LOW (ref 10–30)

## 2024-05-06 LAB — HCG, SERUM, QUALITATIVE: Preg, Serum: POSITIVE — AB

## 2024-05-06 LAB — SALICYLATE LEVEL: Salicylate Lvl: <7 mg/dL — ABNORMAL LOW (ref 7.0–30.0)

## 2024-05-06 MED ORDER — SODIUM CHLORIDE 0.9 % IV BOLUS
1000.0000 mL | Freq: Once | INTRAVENOUS | Status: AC
  Administered 2024-05-06: 1000 mL via INTRAVENOUS

## 2024-05-06 NOTE — ED Triage Notes (Signed)
 Pt BIBA from home for accidental overdose of amitriptyline.  Per EMS pt has chronic abdominal pain of unknown origin. Pt took 5 pills around 3pm today.  Family found pt confused and called EMS.

## 2024-05-06 NOTE — ED Notes (Signed)
 Poison Control called to make writer aware pt is coming with EMS, pt accidentally took 5  50 mg Amitriptyline about 3:30 pm.  Labs: 4 hour post Tylenol  level 7:30 p CMP Salicylate level  EKG now and repeat in 6 hours  Observe for Seizures and HTN, EKG changes Treat seizures with Benzo's QRS prolong with Bi[employer]

## 2024-05-06 NOTE — ED Provider Notes (Signed)
 [place] EMERGENCY DEPARTMENT AT [facility] Provider Note   CSN: [id number] Arrival date & time: 05/06/24  1706     Patient presents with: Drug Overdose   Brenda Martinez is a 58 y.o. female.  Patient is a 58 year old female with a past medical history of depression and anxiety presents to the ED after an accidental overdose.  Patient reports taking her daily morning medications as prescribed including Abilify , hydroxyzine , venlafaxine  and 2x amitriptyline 50 mg.  She began to have abdominal pain this afternoon and took an additional 5X 50 mg amitriptyline at 1500.  Shortly thereafter her family noted her to be confused and EMS was called.  She remained confused with EMS.  No severe vital sign abnormalities.  Patient unable to contribute additional clinical history secondary to altered mental status.    Drug Overdose       Prior to Admission medications   Medication Sig Start Date End Date Taking? Authorizing Provider  amitriptyline (ELAVIL) 50 MG tablet Take 50-100 mg by mouth at bedtime. 04/26/24  Yes [provider]  buPROPion (WELLBUTRIN XL) 150 MG 24 hr tablet Take 150 mg by mouth every morning. 04/19/24  Yes [provider]  acetaminophen  (TYLENOL ) 500 MG tablet Take 500-1,000 mg by mouth every 6 (six) hours as needed for moderate pain (pain score 4-6) or headache.    [provider]  ARIPiprazole  (ABILIFY ) 5 MG tablet Take 1 tablet (5 mg total) by mouth daily. Patient not taking: Reported on 04/18/2024 03/26/24   Delsie Lynwood Morene Lavone, MD  hydrOXYzine  (ATARAX ) 25 MG tablet Take 2 tablets (50 mg total) by mouth every 8 (eight) hours as needed for anxiety. Take 1-2 tablets by mouth up to every 8 (eight) hours as needed for anxiety. Patient not taking: Reported on 04/18/2024 03/26/24   Delsie Lynwood Morene Lavone, MD  LORazepam  (ATIVAN ) 1 MG tablet Take 1 mg by mouth 3 (three) times daily.    [provider]  Multiple  Vitamins-Minerals (MULTIVITAMIN WOMEN 50+) TABS Take 1 tablet by mouth daily.    [provider]  ondansetron  (ZOFRAN -ODT) 4 MG disintegrating tablet Take 1 tablet (4 mg total) by mouth every 8 (eight) hours as needed for nausea or vomiting. 04/02/24   Neysa Caron PARAS, DO  risperiDONE (RISPERDAL) 1 MG tablet Take 1 mg by mouth See admin instructions. Take 1 tablet (1mg ) by mouth at bedtime. May take an additional tablet later in the night if still awake.    [provider]  rosuvastatin (CRESTOR) 5 MG tablet Take 5 mg by mouth daily. 12/20/23   [provider]  sertraline (ZOLOFT) 50 MG tablet Take 100 mg by mouth daily.    [provider]  traZODone  (DESYREL ) 100 MG tablet Take 1 tablet (100 mg total) by mouth at bedtime. Patient not taking: Reported on 04/18/2024 03/26/24   Delsie Lynwood Morene Lavone, MD  venlafaxine  XR (EFFEXOR -XR) 37.5 MG 24 hr capsule Take 3 capsules (112.5 mg total) by mouth daily with breakfast. Patient taking differently: Take 37.5 mg by mouth daily. 03/26/24   Delsie Lynwood Morene Lavone, MD    Allergies: Patient has no known allergies.    Review of Systems  Updated Vital Signs BP 111/72 (BP Location: Right Arm)   Pulse 76   Temp 97.8 F (36.6 C) (Axillary)   Resp 13   LMP 01/27/2012   SpO2 96%   Physical Exam Vitals and nursing note reviewed.  HENT:     Head: Normocephalic  and atraumatic.  Eyes:     Pupils: Pupils are equal, round, and reactive to light.  Cardiovascular:     Rate and Rhythm: Normal rate and regular rhythm.  Pulmonary:     Effort: Pulmonary effort is normal.     Breath sounds: Normal breath sounds.  Abdominal:     Palpations: Abdomen is soft.     Tenderness: There is no abdominal tenderness.  Musculoskeletal:     Cervical back: Neck supple. No tenderness.  Skin:    General: Skin is warm and dry.  Neurological:     Mental Status: She is alert.     Sensory: No sensory deficit.     Motor: No  weakness.     Comments: Oriented to person and time Not oriented to place or situation. Following commands No focal neurologic deficits. Quiet sluggish speech No dysarthria  Psychiatric:        Mood and Affect: Mood normal.     (all labs ordered are listed, but only abnormal results are displayed) Labs Reviewed  COMPREHENSIVE METABOLIC PANEL WITH GFR - Abnormal; Notable for the following components:      Result Value   AST 13 (*)    All other components within normal limits  ACETAMINOPHEN  LEVEL - Abnormal; Notable for the following components:   Acetaminophen  (Tylenol ), Serum <10 (*)    All other components within normal limits  SALICYLATE LEVEL - Abnormal; Notable for the following components:   Salicylate Lvl <7.0 (*)    All other components within normal limits  URINALYSIS, ROUTINE W REFLEX MICROSCOPIC - Abnormal; Notable for the following components:   Color, Urine STRAW (*)    Specific Gravity, Urine 1.003 (*)    All other components within normal limits  RAPID URINE DRUG SCREEN, HOSP PERFORMED - Abnormal; Notable for the following components:   Benzodiazepines POSITIVE (*)    All other components within normal limits  HCG, SERUM, QUALITATIVE - Abnormal; Notable for the following components:   Preg, Serum POSITIVE (*)    All other components within normal limits  HCG, QUANTITATIVE, PREGNANCY - Abnormal; Notable for the following components:   hCG, Beta Chain, Quant, S 7 (*)    All other components within normal limits  ETHANOL  BLOOD GAS, VENOUS  HCG, QUANTITATIVE, PREGNANCY    EKG: EKG Interpretation Date/Time:  Friday May 06 2024 20:47:31 EDT Ventricular Rate:  76 PR Interval:  152 QRS Duration:  93 QT Interval:  380 QTC Calculation: 428 R Axis:   37  Text Interpretation: Sinus rhythm Normal axis No STEMI No acute changes when comapred to previous EKG from 04/18/2024 Confirmed by Gennaro Bouchard ([id number]) on 05/06/2024 9:00:37 PM  Radiology: No results  found.   Procedures   Medications Ordered in the ED  sodium chloride  0.9 % bolus 1,000 mL (1,000 mLs Intravenous New Bag/Given 05/06/24 1742)    Clinical Course as of 05/07/24 0017  Fri May 06, 2024  1755 Discussed with poison control specialist.  Recommends repeat EKG 6 hours from ingestion time.  10 hours total observation time from ingestion. [MP]  2100 Initial laboratory workup notable for positive pregnancy test.  Quantitative value of 7.  Low suspicion for intrauterine pregnancy but will obtain ultrasound here.  Patient's mental status is improving and she remains hemodynamically stable. [MP]  2200 Repeat ECG without QRS prolongation [MP]  Sat May 07, 2024  0015 I, Ozell Marine DO, am transitioning care of this patient to the oncoming provider pending ultrasound results, observation  in the ED until 0 100 and disposition [MP]    Clinical Course User Index [MP] Pamella Ozell LABOR, DO                                 Medical Decision Making 58 year old female with history as above presenting to ED given concern for accidental overdose at home.  Reportedly took 5 x 50 mg amitriptyline by mouth around 1500.  EMS was called given concern for confusion and altered mental status.  She remains confused here.  Hemodynamically stable with no significant vital signs abnormalities.  Will obtain toxicologic workup.  EKG initially shows no evidence of QRS prolongation or RSR prime waves pathopneumonic for TCA overdose.  Will provide IV fluids and discuss further care with poison control consultant.  Amount and/or Complexity of Data Reviewed Labs: ordered. Radiology: ordered.        Final diagnoses:  Accidental overdose, initial encounter  Elevated serum hCG    ED Discharge Orders     None          Pamella Ozell LABOR, DO 05/07/24 0018

## 2024-05-07 NOTE — ED Provider Notes (Signed)
 Care assumed from Sr. Pamella, patient with possible amitryptyline overdose. She needs to be observed until 0100, also has minimally positive HCG (7) - will need to follow up with gynecology. She is also pending pelvic ultrasound.  Patient remained hemodynamically stable without any arrhythmias.  Pelvic ultrasound shows no abnormalities.  Specifically, no evidence of intrauterine pregnancy or ovarian cyst.  I have independently viewed the images, and agree with the radiologist's interpretation.  She is safe for discharge.  She is being referred to her gynecologist for further outpatient evaluation.  Results for orders placed or performed during the hospital encounter of 05/06/24  Comprehensive metabolic panel   Collection Time: 05/06/24  5:46 PM  Result Value Ref Range   Sodium 141 135 - 145 mmol/L   Potassium 3.7 3.5 - 5.1 mmol/L   Chloride 105 98 - 111 mmol/L   CO2 25 22 - 32 mmol/L   Glucose, Bld 97 70 - 99 mg/dL   BUN 9 6 - 20 mg/dL   Creatinine, Ser 9.30 0.44 - 1.00 mg/dL   Calcium 9.8 8.9 - 89.6 mg/dL   Total Protein 6.7 6.5 - 8.1 g/dL   Albumin 3.9 3.5 - 5.0 g/dL   AST 13 (L) 15 - 41 U/L   ALT 10 0 - 44 U/L   Alkaline Phosphatase 71 38 - 126 U/L   Total Bilirubin 0.6 0.0 - 1.2 mg/dL   GFR, Estimated >39 >39 mL/min   Anion gap 11 5 - 15  Acetaminophen  level   Collection Time: 05/06/24  5:46 PM  Result Value Ref Range   Acetaminophen  (Tylenol ), Serum <10 (L) 10 - 30 ug/mL  Ethanol   Collection Time: 05/06/24  5:46 PM  Result Value Ref Range   Alcohol, Ethyl (B) <15 <15 mg/dL  Salicylate level   Collection Time: 05/06/24  5:46 PM  Result Value Ref Range   Salicylate Lvl <7.0 (L) 7.0 - 30.0 mg/dL  hCG, serum, qualitative   Collection Time: 05/06/24  5:46 PM  Result Value Ref Range   Preg, Serum POSITIVE (A) NEGATIVE  Urinalysis, Routine w reflex microscopic -Urine, Clean Catch   Collection Time: 05/06/24  7:12 PM  Result Value Ref Range   Color, Urine STRAW (A) YELLOW    APPearance CLEAR CLEAR   Specific Gravity, Urine 1.003 (L) 1.005 - 1.030   pH 6.0 5.0 - 8.0   Glucose, UA NEGATIVE NEGATIVE mg/dL   Hgb urine dipstick NEGATIVE NEGATIVE   Bilirubin Urine NEGATIVE NEGATIVE   Ketones, ur NEGATIVE NEGATIVE mg/dL   Protein, ur NEGATIVE NEGATIVE mg/dL   Nitrite NEGATIVE NEGATIVE   Leukocytes,Ua NEGATIVE NEGATIVE  Urine rapid drug screen (hosp performed)   Collection Time: 05/06/24  7:13 PM  Result Value Ref Range   Opiates NONE DETECTED NONE DETECTED   Cocaine NONE DETECTED NONE DETECTED   Benzodiazepines POSITIVE (A) NONE DETECTED   Amphetamines NONE DETECTED NONE DETECTED   Tetrahydrocannabinol NONE DETECTED NONE DETECTED   Barbiturates NONE DETECTED NONE DETECTED  hCG, quantitative, pregnancy   Collection Time: 05/06/24  8:34 PM  Result Value Ref Range   hCG, Beta Chain, Quant, S 7 (H) <5 mIU/mL   US  OB LESS THAN 14 WEEKS WITH OB TRANSVAGINAL Result Date: 05/07/2024 CLINICAL DATA:  [id number] Elevated serum hCG [id number] Pelvic pain 390131 EXAM: OBSTETRIC <14 WK US  AND TRANSVAGINAL OB US  TECHNIQUE: Both transabdominal and transvaginal ultrasound examinations were performed for complete evaluation of the gestation as well as the maternal uterus, adnexal regions, and  pelvic cul-de-sac. Transvaginal technique was performed to assess early pregnancy. COMPARISON:  None Available. FINDINGS: Intrauterine gestational sac: None Yolk sac:  Not Visualized. Embryo:  Not Visualized. Cardiac Activity: Not Visualized. Heart Rate:   bpm MSD:   mm    w     d CRL:    mm    w    d                  US  EDC: Subchorionic hemorrhage:  None visualized. Maternal uterus/adnexae: No adnexal mass. Trace free fluid. The patient was unable to void somewhat limiting transvaginal images. IMPRESSION: No intrauterine pregnancy visualized. Differential considerations would include early intrauterine pregnancy too early to visualize, spontaneous abortion, or occult ectopic pregnancy. Recommend  close clinical followup and serial quantitative beta HCGs and ultrasounds. Electronically Signed   By: Franky Crease M.D.   On: 05/07/2024 00:46   CT ABDOMEN PELVIS W CONTRAST Result Date: 04/18/2024 CLINICAL DATA:  rlq pain. EXAM: CT ABDOMEN AND PELVIS WITH CONTRAST TECHNIQUE: Multidetector CT imaging of the abdomen and pelvis was performed using the standard protocol following bolus administration of intravenous contrast. RADIATION DOSE REDUCTION: This exam was performed according to the departmental dose-optimization program which includes automated exposure control, adjustment of the mA and/or kV according to patient size and/or use of iterative reconstruction technique. CONTRAST:  75mL OMNIPAQUE  IOHEXOL  350 MG/ML SOLN COMPARISON:  CT scan abdomen and pelvis from 04/02/2024. FINDINGS: Lower chest: There are dependent changes in the visualized lung bases. No overt consolidation. No pleural effusion. The heart is normal in size. No pericardial effusion. Hepatobiliary: The liver is normal in size. Non-cirrhotic configuration. No suspicious mass. No intrahepatic or extrahepatic bile duct dilation. No calcified gallstones. Normal gallbladder wall thickness. No pericholecystic inflammatory changes. Pancreas: Unremarkable. No pancreatic ductal dilatation or surrounding inflammatory changes. Spleen: Within normal limits. No focal lesion. Adrenals/Urinary Tract: Adrenal glands are unremarkable. No suspicious renal mass. No hydronephrosis. No renal or ureteric calculi. Unremarkable urinary bladder. Stomach/Bowel: No disproportionate dilation of the small or large bowel loops. No evidence of abnormal bowel wall thickening or inflammatory changes. The appendix was not visualized; however there is no acute inflammatory process in the right lower quadrant. Vascular/Lymphatic: No ascites or pneumoperitoneum. No abdominal or pelvic lymphadenopathy, by size criteria. No aneurysmal dilation of the major abdominal arteries. There  are mild peripheral atherosclerotic vascular calcifications of the aorta and its major branches. Reproductive: The uterus is unremarkable. No large adnexal mass. Other: There is a tiny fat containing umbilical hernia. Transverse C-section scar noted. The soft tissues and abdominal wall are otherwise unremarkable. Musculoskeletal: No suspicious osseous lesions. There are mild multilevel degenerative changes in the visualized spine. IMPRESSION: 1. No acute inflammatory process identified within the abdomen or pelvis. 2. Multiple other nonacute observations, as described above. Aortic Atherosclerosis (ICD10-I70.0). Electronically Signed   By: Ree Molt M.D.   On: 04/18/2024 11:50   DG Chest Port 1 View Result Date: 04/18/2024 CLINICAL DATA:  Syncope. EXAM: PORTABLE CHEST 1 VIEW COMPARISON:  01/27/2016. FINDINGS: The heart size and mediastinal contours are within normal limits. No focal consolidation, pleural effusion, or pneumothorax. No acute osseous abnormality. IMPRESSION: No acute cardiopulmonary findings. Electronically Signed   By: Harrietta Sherry M.D.   On: 04/18/2024 09:05      Raford Lenis, MD 05/07/24 [phone]

## 2024-05-07 NOTE — Discharge Instructions (Signed)
 You were seen in the Emergency Department for an accidental overdose on amitriptyline We observed you for several hours and you remained stable Your pregnancy test was positive marginally You will need to follow-up with gynecology in the office Continue taking all of your medications as previously prescribed and do not take more than what is directed Return to the emergency department for any thoughts of harming yourself or others

## 2024-05-25 ENCOUNTER — Other Ambulatory Visit (HOSPITAL_BASED_OUTPATIENT_CLINIC_OR_DEPARTMENT_OTHER): Payer: Self-pay

## 2024-05-26 ENCOUNTER — Other Ambulatory Visit (HOSPITAL_BASED_OUTPATIENT_CLINIC_OR_DEPARTMENT_OTHER): Payer: Self-pay

## 2024-05-26 MED ORDER — URO-SP 118 MG PO CAPS
ORAL_CAPSULE | ORAL | 3 refills | Status: DC
  Filled 2024-05-26: qty 30, 10d supply, fill #0
  Filled 2024-06-01 – 2024-06-03 (×2): qty 30, 10d supply, fill #1

## 2024-06-01 ENCOUNTER — Other Ambulatory Visit (HOSPITAL_BASED_OUTPATIENT_CLINIC_OR_DEPARTMENT_OTHER): Payer: Self-pay

## 2024-06-03 ENCOUNTER — Encounter (HOSPITAL_BASED_OUTPATIENT_CLINIC_OR_DEPARTMENT_OTHER): Payer: Self-pay

## 2024-06-03 ENCOUNTER — Emergency Department (HOSPITAL_BASED_OUTPATIENT_CLINIC_OR_DEPARTMENT_OTHER)
Admission: EM | Admit: 2024-06-03 | Discharge: 2024-06-03 | Disposition: A | Discharge status: Home / Self Care | Attending: Emergency Medicine | Admitting: Emergency Medicine

## 2024-06-03 ENCOUNTER — Emergency Department (HOSPITAL_BASED_OUTPATIENT_CLINIC_OR_DEPARTMENT_OTHER)

## 2024-06-03 ENCOUNTER — Other Ambulatory Visit: Payer: Self-pay

## 2024-06-03 ENCOUNTER — Other Ambulatory Visit (HOSPITAL_BASED_OUTPATIENT_CLINIC_OR_DEPARTMENT_OTHER): Payer: Self-pay

## 2024-06-03 DIAGNOSIS — R1030 Lower abdominal pain, unspecified: Secondary | ICD-10-CM | POA: Diagnosis not present

## 2024-06-03 DIAGNOSIS — R339 Retention of urine, unspecified: Secondary | ICD-10-CM | POA: Diagnosis present

## 2024-06-03 LAB — COMPREHENSIVE METABOLIC PANEL WITH GFR
ALT: 7 U/L (ref 0–44)
AST: 24 U/L (ref 15–41)
Albumin: 4.4 g/dL (ref 3.5–5.0)
Alkaline Phosphatase: 105 U/L (ref 38–126)
Anion gap: 16 — ABNORMAL HIGH (ref 5–15)
BUN: 11 mg/dL (ref 6–20)
CO2: 21 mmol/L — ABNORMAL LOW (ref 22–32)
Calcium: 10.2 mg/dL (ref 8.9–10.3)
Chloride: 100 mmol/L (ref 98–111)
Creatinine, Ser: 0.86 mg/dL (ref 0.44–1.00)
GFR, Estimated: >60 mL/min (ref >60)
Glucose, Bld: 82 mg/dL (ref 70–99)
Potassium: 3.6 mmol/L (ref 3.5–5.1)
Sodium: 137 mmol/L (ref 135–145)
Total Bilirubin: 0.4 mg/dL (ref 0.0–1.2)
Total Protein: 6.7 g/dL (ref 6.5–8.1)

## 2024-06-03 LAB — URINALYSIS, W/ REFLEX TO CULTURE (INFECTION SUSPECTED)
Bacteria, UA: NONE SEEN
Bilirubin Urine: NEGATIVE
Glucose, UA: NEGATIVE mg/dL
Hgb urine dipstick: NEGATIVE
Ketones, ur: NEGATIVE mg/dL
Leukocytes,Ua: NEGATIVE
Nitrite: NEGATIVE
Protein, ur: NEGATIVE mg/dL
Specific Gravity, Urine: 1.007 (ref 1.005–1.030)
pH: 5.5 (ref 5.0–8.0)

## 2024-06-03 LAB — CBC WITH DIFFERENTIAL/PLATELET
Abs Immature Granulocytes: 0.02 K/uL (ref 0.00–0.07)
Basophils Absolute: 0 K/uL (ref 0.0–0.1)
Basophils Relative: 1 %
Eosinophils Absolute: 0.1 K/uL (ref 0.0–0.5)
Eosinophils Relative: 1 %
HCT: 36.8 % (ref 36.0–46.0)
Hemoglobin: 12.5 g/dL (ref 12.0–15.0)
Immature Granulocytes: 0 %
Lymphocytes Relative: 24 %
Lymphs Abs: 1.6 K/uL (ref 0.7–4.0)
MCH: 29.3 pg (ref 26.0–34.0)
MCHC: 34 g/dL (ref 30.0–36.0)
MCV: 86.4 fL (ref 80.0–100.0)
Monocytes Absolute: 0.6 K/uL (ref 0.1–1.0)
Monocytes Relative: 9 %
Neutro Abs: 4.2 K/uL (ref 1.7–7.7)
Neutrophils Relative %: 65 %
Platelets: 232 K/uL (ref 150–400)
RBC: 4.26 MIL/uL (ref 3.87–5.11)
RDW: 12.6 % (ref 11.5–15.5)
WBC: 6.5 K/uL (ref 4.0–10.5)
nRBC: 0 % (ref 0.0–0.2)

## 2024-06-03 LAB — LIPASE, BLOOD: Lipase: 15 U/L (ref 11–51)

## 2024-06-03 MED ORDER — MORPHINE SULFATE (PF) 4 MG/ML IV SOLN
4.0000 mg | Freq: Once | INTRAVENOUS | Status: AC
  Administered 2024-06-03: 4 mg via INTRAVENOUS
  Filled 2024-06-03: qty 1

## 2024-06-03 MED ORDER — ONDANSETRON HCL 4 MG/2ML IJ SOLN
4.0000 mg | Freq: Once | INTRAMUSCULAR | Status: AC
  Administered 2024-06-03: 4 mg via INTRAVENOUS
  Filled 2024-06-03: qty 2

## 2024-06-03 NOTE — ED Notes (Signed)
Patient assisted to restroom to attempt to provide urine sample

## 2024-06-03 NOTE — ED Notes (Signed)
 Patient did not want foley catheter placed prior to d/c.  Able to void on own x 2 prior to leaving.

## 2024-06-03 NOTE — ED Notes (Signed)
 ED Provider at bedside.

## 2024-06-03 NOTE — Discharge Instructions (Signed)
 Evaluation today was overall reassuring.  Please follow-up with your urologist and continue taking the Bactrim as prescribed to you.  If you have urinary retention again, abdominal pain, blood in the urine, develop a fever or any other concerning symptom please return to the ED for further evaluation.

## 2024-06-03 NOTE — ED Triage Notes (Signed)
 Patient arrives POV with complaints of increased urinary retention x3 days. She reports that she has been unable to urinate today. Patient reports bladder pain at an 8/10.

## 2024-06-03 NOTE — ED Notes (Signed)
 Pt ambulated to restroom with minimal assistance, was able to urinate.

## 2024-06-03 NOTE — ED Notes (Signed)
 Pt unable to provide urine d/t retention

## 2024-06-03 NOTE — ED Provider Notes (Signed)
 [place] EMERGENCY DEPARTMENT AT [facility] Provider Note   CSN: [id number] Arrival date & time: 06/03/24  1514     Patient presents with: Urinary Retention  HPI Brenda Martinez is a 58 y.o. female with history of depression presenting for urinary retention.  She states she has had reduced urinary output for 3 days and pain over the bladder.  Denies vaginal or bowel symptoms. States this has been an ongoing issue for few months.  Was recently seen by [facility] and was diagnosed with interstitial cystitis.   HPI     Prior to Admission medications   Medication Sig Start Date End Date Taking? Authorizing Provider  omeprazole (PRILOSEC) 40 MG capsule Take 40 mg by mouth daily. 03/28/24  Yes [provider]  acetaminophen  (TYLENOL ) 500 MG tablet Take 500-1,000 mg by mouth every 6 (six) hours as needed for moderate pain (pain score 4-6) or headache.    [provider]  amitriptyline (ELAVIL) 50 MG tablet Take 50-100 mg by mouth at bedtime. 04/26/24   [provider]  ARIPiprazole  (ABILIFY ) 5 MG tablet Take 1 tablet (5 mg total) by mouth daily. Patient not taking: Reported on 04/18/2024 03/26/24   Delsie Lynwood Morene Lavone, MD  buPROPion (WELLBUTRIN XL) 150 MG 24 hr tablet Take 150 mg by mouth every morning. 04/19/24   [provider]  hydrOXYzine  (ATARAX ) 25 MG tablet Take 2 tablets (50 mg total) by mouth every 8 (eight) hours as needed for anxiety. Take 1-2 tablets by mouth up to every 8 (eight) hours as needed for anxiety. Patient not taking: Reported on 04/18/2024 03/26/24   Delsie Lynwood Morene Lavone, MD  LORazepam  (ATIVAN ) 1 MG tablet Take 1 mg by mouth 3 (three) times daily.    [provider]  Meth-Hyo-M Bl-Na Phos-Ph Sal (URO-SP) 118 MG CAPS Take 1 capsule by mouth 3 (three) times a day as needed (bladder spasm/ pain). 05/26/24     Multiple Vitamins-Minerals (MULTIVITAMIN WOMEN 50+) TABS Take 1 tablet by mouth  daily.    [provider]  ondansetron  (ZOFRAN -ODT) 4 MG disintegrating tablet Take 1 tablet (4 mg total) by mouth every 8 (eight) hours as needed for nausea or vomiting. 04/02/24   Neysa Caron PARAS, DO  risperiDONE (RISPERDAL) 1 MG tablet Take 1 mg by mouth See admin instructions. Take 1 tablet (1mg ) by mouth at bedtime. May take an additional tablet later in the night if still awake.    [provider]  rosuvastatin (CRESTOR) 5 MG tablet Take 5 mg by mouth daily. 12/20/23   [provider]  sertraline (ZOLOFT) 50 MG tablet Take 100 mg by mouth daily.    [provider]  traZODone  (DESYREL ) 100 MG tablet Take 1 tablet (100 mg total) by mouth at bedtime. Patient not taking: Reported on 04/18/2024 03/26/24   Delsie Lynwood Morene Lavone, MD  venlafaxine  XR (EFFEXOR -XR) 37.5 MG 24 hr capsule Take 3 capsules (112.5 mg total) by mouth daily with breakfast. Patient taking differently: Take 37.5 mg by mouth daily. 03/26/24   Delsie Lynwood Morene Lavone, MD    Allergies: Patient has no known allergies.    Review of Systems See HPI  Updated Vital Signs BP 129/78   Pulse 91   Temp 97.8 F (36.6 C) (Oral)   Resp 18   Ht 5' 9 (1.753 m)   Wt 65.8 kg   LMP 01/27/2012   SpO2 100%   BMI 21.42 kg/m   Physical Exam Vitals and  nursing note reviewed.  HENT:     Head: Normocephalic and atraumatic.     Mouth/Throat:     Mouth: Mucous membranes are moist.  Eyes:     General:        Right eye: No discharge.        Left eye: No discharge.     Conjunctiva/sclera: Conjunctivae normal.  Cardiovascular:     Rate and Rhythm: Normal rate and regular rhythm.     Pulses: Normal pulses.     Heart sounds: Normal heart sounds.  Pulmonary:     Effort: Pulmonary effort is normal.     Breath sounds: Normal breath sounds.  Abdominal:     General: Abdomen is flat.     Palpations: Abdomen is soft.     Tenderness: There is abdominal tenderness in the suprapubic area.   Skin:    General: Skin is warm and dry.  Neurological:     General: No focal deficit present.  Psychiatric:        Mood and Affect: Mood normal.     (all labs ordered are listed, but only abnormal results are displayed) Labs Reviewed  COMPREHENSIVE METABOLIC PANEL WITH GFR - Abnormal; Notable for the following components:      Result Value   CO2 21 (*)    Anion gap 16 (*)    All other components within normal limits  URINALYSIS, W/ REFLEX TO CULTURE (INFECTION SUSPECTED) - Abnormal; Notable for the following components:   Color, Urine BLUE (*)    APPearance HAZY (*)    All other components within normal limits  CBC WITH DIFFERENTIAL/PLATELET  LIPASE, BLOOD    EKG: None  Radiology: CT ABDOMEN PELVIS WO CONTRAST Result Date: 06/03/2024 CLINICAL DATA:  Increased urinary retention x3 days. EXAM: CT ABDOMEN AND PELVIS WITHOUT CONTRAST TECHNIQUE: Multidetector CT imaging of the abdomen and pelvis was performed following the standard protocol without IV contrast. RADIATION DOSE REDUCTION: This exam was performed according to the departmental dose-optimization program which includes automated exposure control, adjustment of the mA and/or kV according to patient size and/or use of iterative reconstruction technique. COMPARISON:  March 19, 2024 FINDINGS: Lower chest: No acute abnormality. Hepatobiliary: No focal liver abnormality is seen. No gallstones, gallbladder wall thickening, or biliary dilatation. Pancreas: Unremarkable. No pancreatic ductal dilatation or surrounding inflammatory changes. Spleen: Normal in size without focal abnormality. Adrenals/Urinary Tract: Adrenal glands are unremarkable. Kidneys are normal, without renal calculi, focal lesion, or hydronephrosis. The urinary bladder is markedly distended and is otherwise unremarkable. Stomach/Bowel: Stomach is within normal limits. Appendix appears normal. No evidence of bowel wall thickening, distention, or inflammatory changes.  Vascular/Lymphatic: Aortic atherosclerosis. No enlarged abdominal or pelvic lymph nodes. Reproductive: Uterus and bilateral adnexa are unremarkable. Other: No abdominal wall hernia or abnormality. No abdominopelvic ascites. Musculoskeletal: No acute or significant osseous findings. IMPRESSION: 1. Markedly distended urinary bladder. 2. Aortic atherosclerosis. Electronically Signed   By: [name] Dials M.D.   On: 06/03/2024 19:21     Procedures   Medications Ordered in the ED  morphine  (PF) 4 MG/ML injection 4 mg (4 mg Intravenous Given 06/03/24 1854)  ondansetron  (ZOFRAN ) injection 4 mg (4 mg Intravenous Given 06/03/24 1854)    Clinical Course as of 06/03/24 2127  Fri Jun 03, 2024  1753 Urinalysis, Routine w reflex microscopic -Urine, Clean Catch [JR]    Clinical Course User Index [JR] Emmersyn Kratzke K, PA-C  Medical Decision Making Amount and/or Complexity of Data Reviewed Labs: ordered. Decision-making details documented in ED Course. Radiology: ordered.  Risk Prescription drug management.   58 year old well-appearing female presenting for urinary retention.  Exam notable for suprapubic tenderness but otherwise reassuring.  DDx include bladder outlet obstruction, kidney stone, acute cystitis, pyelonephritis, other.  CT showed a distended bladder.  After morphine  and Zofran  patient was able to go to the bathroom and voided 400 mL per the nurse.  She states she is feeling much better.  Urinalysis was reassuring but was blue in color.  Patient was recently started on methylene blue by her urologist.  Overall workup does not suggest an emergent cause for her urinary retention and patient successfully voided on her own.  Advised her to follow-up with her urologist.  Discussed return precautions.  Discharged good condition.     Final diagnoses:  Urinary retention    ED Discharge Orders     None          Brenda Martinez 06/03/24 2127     Mannie Pac T, DO 06/05/24 1530

## 2024-06-04 ENCOUNTER — Encounter (HOSPITAL_BASED_OUTPATIENT_CLINIC_OR_DEPARTMENT_OTHER): Payer: Self-pay

## 2024-06-04 ENCOUNTER — Emergency Department (HOSPITAL_BASED_OUTPATIENT_CLINIC_OR_DEPARTMENT_OTHER)
Admission: EM | Admit: 2024-06-04 | Discharge: 2024-06-04 | Disposition: A | Discharge status: Home / Self Care | Attending: Emergency Medicine | Admitting: Emergency Medicine

## 2024-06-04 ENCOUNTER — Other Ambulatory Visit: Payer: Self-pay

## 2024-06-04 DIAGNOSIS — R1030 Lower abdominal pain, unspecified: Secondary | ICD-10-CM | POA: Diagnosis not present

## 2024-06-04 DIAGNOSIS — R339 Retention of urine, unspecified: Secondary | ICD-10-CM | POA: Diagnosis not present

## 2024-06-04 DIAGNOSIS — R39198 Other difficulties with micturition: Secondary | ICD-10-CM | POA: Diagnosis present

## 2024-06-04 LAB — URINALYSIS, ROUTINE W REFLEX MICROSCOPIC
Bacteria, UA: NONE SEEN
Bilirubin Urine: NEGATIVE
Glucose, UA: NEGATIVE mg/dL
Hgb urine dipstick: NEGATIVE
Ketones, ur: NEGATIVE mg/dL
Leukocytes,Ua: NEGATIVE
Nitrite: NEGATIVE
Protein, ur: NEGATIVE mg/dL
Specific Gravity, Urine: 1.008 (ref 1.005–1.030)
pH: 5.5 (ref 5.0–8.0)

## 2024-06-04 MED ORDER — TAMSULOSIN HCL 0.4 MG PO CAPS: 0.4000 mg | ORAL_CAPSULE | Freq: Every day | ORAL | 0 refills | Status: AC

## 2024-06-04 MED ORDER — TAMSULOSIN HCL 0.4 MG PO CAPS
0.4000 mg | ORAL_CAPSULE | Freq: Every day | ORAL | 0 refills | Status: DC
  Filled 2024-06-04: qty 14, 14d supply, fill #0

## 2024-06-04 NOTE — ED Notes (Addendum)
 Pt provided with leg bag and securement device for home use. Education provided on foley catheter care. Pt verbalized understanding.

## 2024-06-04 NOTE — ED Triage Notes (Addendum)
 Patient presents with increasing concern of urinary retention. Patient voided 30 minutes prior to arrival. Patient's pain is 10/10. Was evaluated yesterday for similar symptoms. Advise to return to department to have foley catheter placed.

## 2024-06-04 NOTE — ED Provider Notes (Signed)
 [place] EMERGENCY DEPARTMENT AT [facility] Provider Note   CSN: [id number] Arrival date & time: 06/04/24  1349     Patient presents with: Urinary Retention   Brenda Martinez is a 58 y.o. female.  {Add pertinent medical, surgical, social history, OB history to HPI:32932} HPI    58 year old female with a history of hyperlipidemia, anxiety, depression, visit to the emergency department yesterday with abdominal pain who presents with abdominal pain and difficulty urinating.   CT abdomen pelvis was performed yesterday which showed a markedly distended bladder without other acute intra-abdominal abnormalities.  She has seen urology for these symptoms.  Per urology note, she had started having the symptoms in June 2025.  She had been having burning pelvic and abdominal pain for 2 months prior to her urology visit with a waxing and waning timeline, she she was hospitalized in mid June and prescribed Effexor  and Abilify , and noted after that she began having the symptoms.  In the urology note they had discussed her having significant stress.  She saw Dr. Windsor August 8 2 did a thorough review of her history, and discussed that she strongly suspects that she has interstitial cystitis but that it was likely not the only component of pain and they discussed medication options.  Their plan was to use gabapentin, Uribel, continue Elavil and follow-up for cystoscopy.  She reports she has had symptoms on and off over the past several months, with this episode beginning 2 days ago.  Reports that she is having difficulty urinating, has some mild dysuria, and lower abdominal discomfort.  She denies nausea, vomiting, fever, constipation, diarrhea, vaginal bleeding.  Denies any recent medication changes in the last week.  Denies back pain, numbness, weakness, loss control of bowel or bladder.  Past Medical History:  Diagnosis Date  . Contraceptive management   . 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 HYPERLIPIDEMIA   . Other anxiety states      Prior to Admission medications   Medication Sig Start Date End Date Taking? Authorizing Provider  acetaminophen  (TYLENOL ) 500 MG tablet Take 500-1,000 mg by mouth every 6 (six) hours as needed for moderate pain (pain score 4-6) or headache.    [provider]  amitriptyline (ELAVIL) 50 MG tablet Take 50-100 mg by mouth at bedtime. 04/26/24   [provider]  ARIPiprazole  (ABILIFY ) 5 MG tablet Take 1 tablet (5 mg total) by mouth daily. Patient not taking: Reported on 04/18/2024 03/26/24   Delsie Lynwood Morene Lavone, MD  buPROPion (WELLBUTRIN XL) 150 MG 24 hr tablet Take 150 mg by mouth every morning. 04/19/24   [provider]  hydrOXYzine  (ATARAX ) 25 MG tablet Take 2 tablets (50 mg total) by mouth every 8 (eight) hours as needed for anxiety. Take 1-2 tablets by mouth up to every 8 (eight) hours as needed for anxiety. Patient not taking: Reported on 04/18/2024 03/26/24   Delsie Lynwood Morene Lavone, MD  LORazepam  (ATIVAN ) 1 MG tablet Take 1 mg by mouth 3 (three) times daily.    [provider]  Meth-Hyo-M Bl-Na Phos-Ph Sal (URO-SP) 118 MG CAPS Take 1 capsule by mouth 3 (three) times a day as needed (bladder spasm/ pain). 05/26/24     Multiple Vitamins-Minerals (MULTIVITAMIN WOMEN 50+) TABS Take 1 tablet by mouth daily.    [provider]  omeprazole (PRILOSEC) 40 MG capsule Take 40 mg by mouth daily. 03/28/24   [provider]  ondansetron  (ZOFRAN -ODT) 4 MG disintegrating tablet  Take 1 tablet (4 mg total) by mouth every 8 (eight) hours as needed for nausea or vomiting. 04/02/24   Neysa Caron PARAS, DO  risperiDONE (RISPERDAL) 1 MG tablet Take 1 mg by mouth See admin instructions. Take 1 tablet (1mg ) by mouth at bedtime. May take an additional tablet later in the night if still awake.    [provider]  rosuvastatin (CRESTOR) 5 MG tablet Take 5 mg by mouth daily. 12/20/23   [provider]  sertraline (ZOLOFT) 50 MG tablet Take 100 mg by mouth daily.    [provider]  traZODone  (DESYREL ) 100 MG tablet Take 1 tablet (100 mg total) by mouth at bedtime. Patient not taking: Reported on 04/18/2024 03/26/24   Delsie Lynwood Morene Lavone, MD  venlafaxine  XR (EFFEXOR -XR) 37.5 MG 24 hr capsule Take 3 capsules (112.5 mg total) by mouth daily with breakfast. Patient taking differently: Take 37.5 mg by mouth daily. 03/26/24   Delsie Lynwood Morene Lavone, MD    Allergies: Patient has no known allergies.    Review of Systems  Updated Vital Signs BP 109/70 (BP Location: Right Arm)   Pulse 99   Temp 98.5 F (36.9 C) (Oral)   Resp 18   Ht 5' 9 (1.753 m)   Wt 63.5 kg   LMP 01/27/2012   SpO2 95%   BMI 20.67 kg/m   Physical Exam Vitals and nursing note reviewed.  Constitutional:      General: She is not in acute distress.    Appearance: She is well-developed. She is not diaphoretic.  HENT:     Head: Normocephalic and atraumatic.  Eyes:     Conjunctiva/sclera: Conjunctivae normal.  Cardiovascular:     Rate and Rhythm: Normal rate and regular rhythm.     Pulses: Normal pulses.  Pulmonary:     Effort: Pulmonary effort is normal. No respiratory distress.  Abdominal:     General: There is no distension.     Palpations: Abdomen is soft.     Tenderness: There is abdominal tenderness (mild suprapubic). There is no guarding.  Musculoskeletal:        General: No tenderness.     Cervical back: Normal range of motion.  Skin:    General: Skin is warm and dry.     Findings: No erythema or rash.  Neurological:     Mental Status: She is alert and oriented to person, place, and time.     (all labs ordered are listed, but only abnormal results are displayed) Labs Reviewed - No data to display  EKG: None  Radiology: CT ABDOMEN PELVIS WO CONTRAST Result Date: 06/03/2024 CLINICAL DATA:  Increased urinary retention x3 days. EXAM: CT ABDOMEN AND PELVIS WITHOUT  CONTRAST TECHNIQUE: Multidetector CT imaging of the abdomen and pelvis was performed following the standard protocol without IV contrast. RADIATION DOSE REDUCTION: This exam was performed according to the departmental dose-optimization program which includes automated exposure control, adjustment of the mA and/or kV according to patient size and/or use of iterative reconstruction technique. COMPARISON:  March 19, 2024 FINDINGS: Lower chest: No acute abnormality. Hepatobiliary: No focal liver abnormality is seen. No gallstones, gallbladder wall thickening, or biliary dilatation. Pancreas: Unremarkable. No pancreatic ductal dilatation or surrounding inflammatory changes. Spleen: Normal in size without focal abnormality. Adrenals/Urinary Tract: Adrenal glands are unremarkable. Kidneys are normal, without renal calculi, focal lesion, or hydronephrosis. The urinary bladder is markedly distended and is otherwise unremarkable. Stomach/Bowel: Stomach is within normal limits. Appendix appears normal. No evidence  of bowel wall thickening, distention, or inflammatory changes. Vascular/Lymphatic: Aortic atherosclerosis. No enlarged abdominal or pelvic lymph nodes. Reproductive: Uterus and bilateral adnexa are unremarkable. Other: No abdominal wall hernia or abnormality. No abdominopelvic ascites. Musculoskeletal: No acute or significant osseous findings. IMPRESSION: 1. Markedly distended urinary bladder. 2. Aortic atherosclerosis. Electronically Signed   By: [name] Dials M.D.   On: 06/03/2024 19:21    {Document cardiac monitor, telemetry assessment procedure when appropriate:32947} Procedures   Medications Ordered in the ED - No data to display    {Click here for ABCD2, HEART and other calculators REFRESH Note before signing:1}                              Medical Decision Making Amount and/or Complexity of Data Reviewed Labs: ordered.   57 year old female with a history of hyperlipidemia, anxiety,  depression, visit to the emergency department yesterday with abdominal pain who presents with abdominal pain and difficulty urinating.  Reviewed the labs that were obtained yesterday, and do not see benefit in repeating these at this time.  Plan for urinalysis and measure postvoid residual.  She does not have symptoms that are consistent with cauda equina.   Found to have 289 cc on postvoid residual.  Given her symptoms and retention greater than 250cc, will place Foley catheter and have her follow up with Urology.  Urinalysis was completed which was not consistent with urinary tract infection.  Encouraged to continue  {Document critical care time when appropriate  Document review of labs and clinical decision tools ie CHADS2VASC2, etc  Document your independent review of radiology images and any outside records  Document your discussion with family members, caretakers and with consultants  Document social determinants of health affecting pt's care  Document your decision making why or why not admission, treatments were needed:32947:::1}   Final diagnoses:  None    ED Discharge Orders     None

## 2024-06-04 NOTE — ED Notes (Signed)
 PT's family member reported an urinary output of and disposed of it in the restroom. UA obtained

## 2024-06-04 NOTE — ED Notes (Addendum)
 Pt urine output after using restroom. Bladder scan amount post void. MD made aware.

## 2024-06-04 NOTE — ED Notes (Signed)
 Reviewed discharge instructions, follow up and home care with pt. Pt verbalized understanding and had no further questions. Pt exited ED without complications.

## 2024-06-04 NOTE — ED Notes (Signed)
 PT bladder scanned for a volume of . Pre and Post void bladder scans seen by EDP.

## 2024-06-05 ENCOUNTER — Other Ambulatory Visit (HOSPITAL_BASED_OUTPATIENT_CLINIC_OR_DEPARTMENT_OTHER): Payer: Self-pay

## 2024-06-05 LAB — URINE CULTURE: Culture: NO GROWTH

## 2024-06-06 ENCOUNTER — Other Ambulatory Visit (HOSPITAL_BASED_OUTPATIENT_CLINIC_OR_DEPARTMENT_OTHER): Payer: Self-pay

## 2024-06-08 ENCOUNTER — Encounter (HOSPITAL_BASED_OUTPATIENT_CLINIC_OR_DEPARTMENT_OTHER): Payer: Self-pay

## 2024-06-08 ENCOUNTER — Other Ambulatory Visit: Payer: Self-pay

## 2024-06-08 ENCOUNTER — Emergency Department (HOSPITAL_BASED_OUTPATIENT_CLINIC_OR_DEPARTMENT_OTHER)
Admission: EM | Admit: 2024-06-08 | Discharge: 2024-06-08 | Disposition: A | Discharge status: Home / Self Care | Attending: Emergency Medicine | Admitting: Emergency Medicine

## 2024-06-08 DIAGNOSIS — K5900 Constipation, unspecified: Secondary | ICD-10-CM | POA: Diagnosis not present

## 2024-06-08 DIAGNOSIS — R1030 Lower abdominal pain, unspecified: Secondary | ICD-10-CM

## 2024-06-08 DIAGNOSIS — N390 Urinary tract infection, site not specified: Secondary | ICD-10-CM | POA: Diagnosis not present

## 2024-06-08 LAB — URINALYSIS, ROUTINE W REFLEX MICROSCOPIC
Bilirubin Urine: NEGATIVE
Glucose, UA: NEGATIVE mg/dL
Ketones, ur: NEGATIVE mg/dL
Nitrite: NEGATIVE
Specific Gravity, Urine: 1.005 (ref 1.005–1.030)
pH: 5.5 (ref 5.0–8.0)

## 2024-06-08 MED ORDER — CEPHALEXIN 500 MG PO CAPS: 500.0000 mg | ORAL_CAPSULE | Freq: Two times a day (BID) | ORAL | 0 refills | Status: AC

## 2024-06-08 MED ORDER — SENNOSIDES-DOCUSATE SODIUM 8.6-50 MG PO TABS: 1.0000 | ORAL_TABLET | Freq: Two times a day (BID) | ORAL | 0 refills | Status: AC | PRN

## 2024-06-08 NOTE — ED Triage Notes (Signed)
 Patient arrives with catheter placed a couple days ago. She has an appointment with the urologist coming up but she is having some discomfort and wants to ensure it is still in place. The bag appears to be draining properly in triage, the urine is a green/blueish color.   She also reports constipation.

## 2024-06-08 NOTE — ED Notes (Signed)
 Lab called and made aware of need to add urine culture to urine in lab.

## 2024-06-08 NOTE — ED Provider Notes (Signed)
 [place] EMERGENCY DEPARTMENT AT [facility] Provider Note   CSN: [id number] Arrival date & time: 06/08/24  8161     Patient presents with: Urinary Cath Problem   Brenda Martinez is a 58 y.o. female.  {Add pertinent medical, surgical, social history, OB history to HPI:32947} HPI     This afternoon became pinching/sticking sensation around where catheter comes out Has had constipation since catheter placed, last night small BM, more afraid to strain with catheter in.   Has not been taking any stool softeners yet No fevers, no nausea or vomiting Burning sensation in lower abdomen, does feel different than prior IC  History reviewed. No pertinent surgical history.    Prior to Admission medications   Medication Sig Start Date End Date Taking? Authorizing Provider  acetaminophen  (TYLENOL ) 500 MG tablet Take 500-1,000 mg by mouth every 6 (six) hours as needed for moderate pain (pain score 4-6) or headache.    [provider]  amitriptyline (ELAVIL) 50 MG tablet Take 50-100 mg by mouth at bedtime. 04/26/24   [provider]  ARIPiprazole  (ABILIFY ) 5 MG tablet Take 1 tablet (5 mg total) by mouth daily. Patient not taking: Reported on 04/18/2024 03/26/24   Delsie Lynwood Morene Lavone, MD  buPROPion (WELLBUTRIN XL) 150 MG 24 hr tablet Take 150 mg by mouth every morning. 04/19/24   [provider]  hydrOXYzine  (ATARAX ) 25 MG tablet Take 2 tablets (50 mg total) by mouth every 8 (eight) hours as needed for anxiety. Take 1-2 tablets by mouth up to every 8 (eight) hours as needed for anxiety. Patient not taking: Reported on 04/18/2024 03/26/24   Delsie Lynwood Morene Lavone, MD  LORazepam  (ATIVAN ) 1 MG tablet Take 1 mg by mouth 3 (three) times daily.    [provider]  Meth-Hyo-M Bl-Na Phos-Ph Sal (URO-SP) 118 MG CAPS Take 1 capsule by mouth 3 (three) times a day as needed (bladder spasm/ pain). 05/26/24     Multiple Vitamins-Minerals (MULTIVITAMIN  WOMEN 50+) TABS Take 1 tablet by mouth daily.    [provider]  omeprazole (PRILOSEC) 40 MG capsule Take 40 mg by mouth daily. 03/28/24   [provider]  ondansetron  (ZOFRAN -ODT) 4 MG disintegrating tablet Take 1 tablet (4 mg total) by mouth every 8 (eight) hours as needed for nausea or vomiting. 04/02/24   Neysa Caron PARAS, DO  risperiDONE (RISPERDAL) 1 MG tablet Take 1 mg by mouth See admin instructions. Take 1 tablet (1mg ) by mouth at bedtime. May take an additional tablet later in the night if still awake.    [provider]  rosuvastatin (CRESTOR) 5 MG tablet Take 5 mg by mouth daily. 12/20/23   [provider]  sertraline (ZOLOFT) 50 MG tablet Take 100 mg by mouth daily.    [provider]  tamsulosin  (FLOMAX ) 0.4 MG CAPS capsule Take 1 capsule (0.4 mg total) by mouth daily for 14 days. 06/04/24 06/18/24  Dreama Longs, MD  traZODone  (DESYREL ) 100 MG tablet Take 1 tablet (100 mg total) by mouth at bedtime. Patient not taking: Reported on 04/18/2024 03/26/24   Delsie Lynwood Morene Lavone, MD  venlafaxine  XR (EFFEXOR -XR) 37.5 MG 24 hr capsule Take 3 capsules (112.5 mg total) by mouth daily with breakfast. Patient taking differently: Take 37.5 mg by mouth daily. 03/26/24   Delsie Lynwood Morene Lavone, MD    Allergies: Patient has no known allergies.    Review of Systems  Updated Vital Signs BP 112/70 (BP Location: Right Arm)  Pulse 92   Temp (!) 97.5 F (36.4 C) (Temporal)   Resp 16   LMP 01/27/2012   SpO2 95%   Physical Exam  (all labs ordered are listed, but only abnormal results are displayed) Labs Reviewed - No data to display  EKG: None  Radiology: No results found.  {Document cardiac monitor, telemetry assessment procedure when appropriate:32947} Procedures   Medications Ordered in the ED - No data to display    {Click here for ABCD2, HEART and other calculators REFRESH Note before signing:1}                               Medical Decision Making Amount and/or Complexity of Data Reviewed Labs: ordered.   ***  {Document critical care time when appropriate  Document review of labs and clinical decision tools ie CHADS2VASC2, etc  Document your independent review of radiology images and any outside records  Document your discussion with family members, caretakers and with consultants  Document social determinants of health affecting pt's care  Document your decision making why or why not admission, treatments were needed:32947:::1}   Final diagnoses:  None    ED Discharge Orders     None

## 2024-06-11 LAB — URINE CULTURE: Culture: NO GROWTH

## 2024-06-12 ENCOUNTER — Emergency Department (HOSPITAL_BASED_OUTPATIENT_CLINIC_OR_DEPARTMENT_OTHER)
Admission: EM | Admit: 2024-06-12 | Discharge: 2024-06-12 | Disposition: A | Discharge status: Home / Self Care | Attending: Emergency Medicine | Admitting: Emergency Medicine

## 2024-06-12 ENCOUNTER — Other Ambulatory Visit: Payer: Self-pay

## 2024-06-12 ENCOUNTER — Encounter (HOSPITAL_BASED_OUTPATIENT_CLINIC_OR_DEPARTMENT_OTHER): Payer: Self-pay | Admitting: Emergency Medicine

## 2024-06-12 DIAGNOSIS — N3010 Interstitial cystitis (chronic) without hematuria: Secondary | ICD-10-CM | POA: Insufficient documentation

## 2024-06-12 DIAGNOSIS — R109 Unspecified abdominal pain: Secondary | ICD-10-CM | POA: Diagnosis present

## 2024-06-12 DIAGNOSIS — R1030 Lower abdominal pain, unspecified: Secondary | ICD-10-CM

## 2024-06-12 LAB — URINALYSIS, ROUTINE W REFLEX MICROSCOPIC
Bacteria, UA: NONE SEEN
Bilirubin Urine: NEGATIVE
Glucose, UA: NEGATIVE mg/dL
Hgb urine dipstick: NEGATIVE
Ketones, ur: NEGATIVE mg/dL
Leukocytes,Ua: NEGATIVE
Nitrite: NEGATIVE
Protein, ur: NEGATIVE mg/dL
Specific Gravity, Urine: 1.01 (ref 1.005–1.030)
pH: 6.5 (ref 5.0–8.0)

## 2024-06-12 LAB — CBC WITH DIFFERENTIAL/PLATELET
Abs Immature Granulocytes: 0.02 K/uL (ref 0.00–0.07)
Basophils Absolute: 0 K/uL (ref 0.0–0.1)
Basophils Relative: 0 %
Eosinophils Absolute: 0 K/uL (ref 0.0–0.5)
Eosinophils Relative: 1 %
HCT: 36.4 % (ref 36.0–46.0)
Hemoglobin: 12.2 g/dL (ref 12.0–15.0)
Immature Granulocytes: 0 %
Lymphocytes Relative: 17 %
Lymphs Abs: 0.9 K/uL (ref 0.7–4.0)
MCH: 29.3 pg (ref 26.0–34.0)
MCHC: 33.5 g/dL (ref 30.0–36.0)
MCV: 87.3 fL (ref 80.0–100.0)
Monocytes Absolute: 0.4 K/uL (ref 0.1–1.0)
Monocytes Relative: 8 %
Neutro Abs: 3.7 K/uL (ref 1.7–7.7)
Neutrophils Relative %: 74 %
Platelets: 252 K/uL (ref 150–400)
RBC: 4.17 MIL/uL (ref 3.87–5.11)
RDW: 12.6 % (ref 11.5–15.5)
WBC: 5.1 K/uL (ref 4.0–10.5)
nRBC: 0 % (ref 0.0–0.2)

## 2024-06-12 LAB — BASIC METABOLIC PANEL WITH GFR
Anion gap: 13 (ref 5–15)
BUN: 11 mg/dL (ref 6–20)
CO2: 23 mmol/L (ref 22–32)
Calcium: 10.4 mg/dL — ABNORMAL HIGH (ref 8.9–10.3)
Chloride: 103 mmol/L (ref 98–111)
Creatinine, Ser: 0.78 mg/dL (ref 0.44–1.00)
GFR, Estimated: >60 mL/min (ref >60)
Glucose, Bld: 95 mg/dL (ref 70–99)
Potassium: 4 mmol/L (ref 3.5–5.1)
Sodium: 139 mmol/L (ref 135–145)

## 2024-06-12 NOTE — Discharge Instructions (Signed)
 You were seen for your lower abdominal pain in the emergency department.  It is likely from your interstitial cystitis.  At home, please continue the pain medication given to you by urology.    Check your MyChart online for the results of any tests that had not resulted by the time you left the emergency department.   Follow-up with your primary doctor in 2-3 days regarding your visit.  Follow-up with urology within 1 to 2 weeks.  Return immediately to the emergency department if you experience any of the following: Fevers, vomiting, diarrhea, or any other concerning symptoms.    Thank you for visiting our Emergency Department. It was a pleasure taking care of you today.

## 2024-06-12 NOTE — ED Provider Notes (Signed)
 [place] EMERGENCY DEPARTMENT AT [facility] Provider Note   CSN: [id number] Arrival date & time: 06/12/24  9191     Patient presents with: Abdominal Pain   Brenda Martinez is a 58 y.o. female.  {Add pertinent medical, surgical, social history, OB history to HPI:7442} 58 year old female with a history of interstitial cystitis who presents to the emergency department with abdominal pain.  Patient reports that she started having some lower abdominal pain last night.  No nausea or vomiting or diarrhea.  No fevers.  Describes it as a sharp sensation.  Does not radiate.  Currently is mild.  Does frequently get lower abdominal pain from her interstitial cystitis.  Has been to the emergency department multiple times this year for lower abdominal and since June has had 3 CT scans that have not showed any acute abnormality.  Has been seeing urology and had a Foley replaced on Friday after she failed a void trial.  Currently on Keflex  for possible UTI but based on the urine culture was negative.       Prior to Admission medications   Medication Sig Start Date End Date Taking? Authorizing Provider  acetaminophen  (TYLENOL ) 500 MG tablet Take 500-1,000 mg by mouth every 6 (six) hours as needed for moderate pain (pain score 4-6) or headache.    [provider]  amitriptyline (ELAVIL) 50 MG tablet Take 50-100 mg by mouth at bedtime. 04/26/24   [provider]  ARIPiprazole  (ABILIFY ) 5 MG tablet Take 1 tablet (5 mg total) by mouth daily. Patient not taking: Reported on 04/18/2024 03/26/24   Delsie Lynwood Morene Lavone, MD  buPROPion (WELLBUTRIN XL) 150 MG 24 hr tablet Take 150 mg by mouth every morning. 04/19/24   [provider]  cephALEXin  (KEFLEX ) 500 MG capsule Take 1 capsule (500 mg total) by mouth 2 (two) times daily for 7 days. 06/08/24 06/15/24  Dreama Longs, MD  hydrOXYzine  (ATARAX ) 25 MG tablet Take 2 tablets (50 mg total) by mouth every 8 (eight) hours as  needed for anxiety. Take 1-2 tablets by mouth up to every 8 (eight) hours as needed for anxiety. Patient not taking: Reported on 04/18/2024 03/26/24   Delsie Lynwood Morene Lavone, MD  LORazepam  (ATIVAN ) 1 MG tablet Take 1 mg by mouth 3 (three) times daily.    [provider]  Meth-Hyo-M Bl-Na Phos-Ph Sal (URO-SP) 118 MG CAPS Take 1 capsule by mouth 3 (three) times a day as needed (bladder spasm/ pain). 05/26/24     Multiple Vitamins-Minerals (MULTIVITAMIN WOMEN 50+) TABS Take 1 tablet by mouth daily.    [provider]  omeprazole (PRILOSEC) 40 MG capsule Take 40 mg by mouth daily. 03/28/24   [provider]  ondansetron  (ZOFRAN -ODT) 4 MG disintegrating tablet Take 1 tablet (4 mg total) by mouth every 8 (eight) hours as needed for nausea or vomiting. 04/02/24   Neysa Caron PARAS, DO  risperiDONE (RISPERDAL) 1 MG tablet Take 1 mg by mouth See admin instructions. Take 1 tablet (1mg ) by mouth at bedtime. May take an additional tablet later in the night if still awake.    [provider]  rosuvastatin (CRESTOR) 5 MG tablet Take 5 mg by mouth daily. 12/20/23   [provider]  senna-docusate (SENOKOT-S) 8.6-50 MG tablet Take 1 tablet by mouth 2 (two) times daily as needed for mild constipation. 06/08/24   Dreama Longs, MD  sertraline (ZOLOFT) 50 MG tablet Take 100 mg by mouth daily.    [provider]  tamsulosin  (FLOMAX ) 0.4 MG CAPS capsule Take 1 capsule (0.4 mg total) by mouth daily for 14 days. 06/04/24 06/18/24  Dreama Longs, MD  traZODone  (DESYREL ) 100 MG tablet Take 1 tablet (100 mg total) by mouth at bedtime. Patient not taking: Reported on 04/18/2024 03/26/24   Delsie Lynwood Morene Lavone, MD  venlafaxine  XR (EFFEXOR -XR) 37.5 MG 24 hr capsule Take 3 capsules (112.5 mg total) by mouth daily with breakfast. Patient taking differently: Take 37.5 mg by mouth daily. 03/26/24   Delsie Lynwood Morene Lavone, MD    Allergies: Patient has no known  allergies.    Review of Systems  Updated Vital Signs BP 117/80 (BP Location: Right Arm)   Pulse 86   Temp 99.6 F (37.6 C) (Oral)   Resp 18   LMP 01/27/2012   SpO2 99%   Physical Exam Abdominal:     General: There is no distension.     Palpations: There is no mass.     Tenderness: There is abdominal tenderness (Suprapubic and right lower quadrant). There is no guarding.     Comments: Foley catheter in place with green-colored urine which has been present since starting methylene blew several weeks ago     (all labs ordered are listed, but only abnormal results are displayed) Labs Reviewed - No data to display  EKG: None  Radiology: No results found.  {Document cardiac monitor, telemetry assessment procedure when appropriate:32947} Procedures  EMERGENCY DEPARTMENT ULTRASOUND  Study: Limited Ultrasound of Bladder  INDICATIONS: to assess for urinary retention and/or bladder volume prior to urinary catheter Multiple views of the bladder were obtained in real-time in the transverse and longitudinal planes with a multi-frequency probe.  PERFORMED BY: Myself IMAGES ARCHIVED?: No LIMITATIONS:  none INTERPRETATION: Minimal Volume and foley bulb in correct location. No retained urine.   Medications Ordered in the ED - No data to display    {Click here for ABCD2, HEART and other calculators REFRESH Note before signing:1}                              Medical Decision Making Amount and/or Complexity of Data Reviewed Labs: ordered.   ***  {Document critical care time when appropriate  Document review of labs and clinical decision tools ie CHADS2VASC2, etc  Document your independent review of radiology images and any outside records  Document your discussion with family members, caretakers and with consultants  Document social determinants of health affecting pt's care  Document your decision making why or why not admission, treatments were needed:32947:::1}   Final  diagnoses:  None    ED Discharge Orders     None

## 2024-06-12 NOTE — ED Triage Notes (Signed)
 Reports lower abd pain and urinary urgency. Has catheter in placed and draining. Reports output last night.

## 2024-06-12 NOTE — ED Notes (Signed)
 Pt given discharge instructions and reviewed follow-up care at Urology. Pt to make appointment. Opportunities given for questions. Pt verbalizes understanding. Bethena Powell SAUNDERS, RN

## 2024-06-13 ENCOUNTER — Ambulatory Visit (HOSPITAL_COMMUNITY): Admission: EM | Admit: 2024-06-13 | Discharge: 2024-06-13 | Disposition: A | Discharge status: Home / Self Care

## 2024-06-13 DIAGNOSIS — F05 Delirium due to known physiological condition: Secondary | ICD-10-CM

## 2024-06-13 NOTE — Discharge Summary (Signed)
 Brenda Martinez to be discharged Home per NP order. An After Visit Summary was printed and given to the patient by provider. Patient escorted out and discharged home via private auto.  [name]  06/13/2024 2:51 PM

## 2024-06-13 NOTE — ED Provider Notes (Addendum)
 Behavioral Health Urgent Care Medical Screening Exam  Patient Name: Brenda Martinez MRN: [id number] Date of Evaluation: 06/13/24 Chief Complaint:  Some confusion  Diagnosis:  Final diagnoses:  Acute confusional state   History of Present illness: Brenda Martinez is a 59 y.o. female with a prior documented history of MDD who presented today, 09/01 to the [facility] accompanied by her husband for a change in her mental status, and requesting an evaluation.  Assessment: Patient is assessed during encounter with her husband in the room with her verbal consent. She is a poor historian, and husband participates in assessment with her consent as well. During encounter, pt & husband report that confusion started 1 week ago, they presented to the ER, but she was diagnosed with a UTI, an indwelling urinary catheter inserted also a week ago, as she was unable to void on her own as she also has urinary retention with her recent diagnosis of  interstitial cystitis. Husband shares that 3 days ago, the catheter was pulled out, patient was able to void at primary Physician's office, was instructed to go home, but was unable to void when they got home, they returned to the provider's office, where the indwelling catheter was reinserted.  He shares that they have to return to the office today is her tachycardia.  As per chart review, patient was in the ER at [facility] with complaints of lower abdominal pain, but they were discharged after testing was completed.  Chart review also shows that urine culture was completed and was negative for UTIs, patient is currently on Keflex .  Husband reports that the reason for this presentation today is because patient woke up earlier today morning, and stated that she felt as though there were people knocking on their which rendered him feeling worried that she was hearing things.  Husband also worried about patient's recent change in mental status.  On assessment, patient  presents with a depressed mood, but she is oriented to person, place, she knows that it is September 2025, is uncertain of the day, but is reoriented that it is September 1.  She knows the current president of the [place] , she knows that she is in [place], [place] .  Thinks that she is in the [employer], ER.  Reoriented that she is at the [facility].  Presents with flat affect and depressed mood, seems anxious, attention to personal hygiene and grooming is fair, eye contact is good, speech is clear & coherent. Thought contents are organized and logical, and pt currently denies SI/HI/AVH or paranoia. There is no evidence of delusional thoughts.  Patient currently denies first rank symptoms.  There are no overt signs of psychosis.  We discussed medications.  Husband reported that the patient is on the following medications: Amitriptyline, stated he is unsure of the strength, but that she takes 1 pill nightly for sleep, states that it is also helping with her interstitial cystitis.  Reports that patient takes Wellbutrin 150 mg, states that primary care provider is in the process of tapering down this medication.  Also states that patient takes Ativan  1 mg 3 times daily, but is currently taking 1 mg in the morning, 0.5 mg 3 times daily, at the order of the PCP as well.  He reports that patient also takes Zoloft previously was 200 mg but currently taking 150 mg daily also being tapered by PCP.  Husband reported that patient was recently put on gabapentin, he is unsure of the  strength but states that he administers all of patient's medications and follows directions on the bottles due to a recent overdose which she states was accidental in nature, whereby patient took 5 pills of the amitriptyline at 1 time, and ended up in the ER.  He reports that this happenned in June of this year.  He reports that most recently, tamsulosin  was also added to the patient's medication  regimen.  Patient reported depressive symptoms including anhedonia, but reports that her sleep quality has been good recently, she reports feelings of guilt regarding not being able to take care of her 2 year old disabled son as well as she wants to.  She reports a decreased energy level which has been ongoing for a while now, reports persistent episodes of disorientation, where she is very forgetful with memory lapses reports that her appetite is stable, reports poor concentration levels, describes symptoms consistent with psychomotor retardation.  Also describes symptoms consistent with GAD; restlessness, persistent worrying, muscle tension reports that the above symptoms have been ongoing for at least the past 6 months now.  Denies any other symptoms consistent with any other mental health conditions.  Denies substance use.     Recommendations: Outpatient follow-up with a mental health provider as well as medication adjustments by a mental health provider. Writer educated patient and husband regarding the dangers of putting mental health medications in the hands of her primary care provider to manage them.  Husband talked extensively about the year prior outpatient psychiatrist whom he states was called Dr. Vincente, whom he states was an over prescriber, and whom he states he stopped taking patient there because his primary care provider was questioning awaiting that was being prescribed.   Writer also educated about the fact that a combination of patient's medications as well as UTI could have been causing her confusion symptoms, and educated to follow up on any more tapers, and to make an appointment with the mental health provider that was provided to them during this appointment in order to complete any further medication adjustments.  Writer provided information for the [facility] outpatient clinic on [address], asked that he make an appointment with Dr. Sayeed Arfeen for management of the  patient's psychotropic medications.  Writer also educated husband to ask same location to make appointments for therapy for patient.  Writer asked that husband return patient to this location should her mental health worsen prior to him being able to see a mental health provider, and he verbalized understanding, both left the [facility] in no acute distress.  Patient denied any plans or intent to harm herself or anyone else, prior to leaving our facility.  Suicide Risk Assessment: Mild:  There are no identifiable suicide plans, no associated intent, mild dysphoria and related symptoms, good self-control (both objective and subjective assessment), few other risk factors, and identifiable protective factors, including available and accessible social support. Husband is agreeable to taking care of patient and bringing her back should her mental status worsen, and will make an appointment for follow up with a mental health provider tomorrow. Educated on community resources for mental health and verbalizes understanding.  Flowsheet Row ED from 06/13/2024 in [facility] ED from 06/12/2024 in [facility] Emergency Department at [facility] ED from 06/08/2024 in [facility] Emergency Department at [facility]  C-SSRS RISK CATEGORY No Risk No Risk No Risk    Psychiatric Specialty Exam  Presentation  General Appearance:Disheveled  Eye Contact:Fair  Speech:Clear and  Coherent  Speech Volume:Decreased  Handedness:Right   Mood and Affect  Mood: Anxious; Depressed  Affect: Depressed   Thought Process  Thought Processes: Coherent  Descriptions of Associations:Intact  Orientation:Full (Time, Place and Person)  Thought Content:Logical    Hallucinations:None  Ideas of Reference:None  Suicidal Thoughts:No  Homicidal Thoughts:No   Sensorium  Memory: Immediate Fair  Judgment: Fair  Insight: Fair   Restaurant manager, fast food  Concentration: Fair  Attention Span: Fair  Recall: [employer] of Knowledge: Fair  Language: Fair   Psychomotor Activity  Psychomotor Activity: Normal   Assets  Assets: Resilience   Sleep  Sleep: Good  Number of hours: No data recorded  Physical Exam: Physical Exam Vitals reviewed.  HENT:     Head: Normocephalic.  Eyes:     Pupils: Pupils are equal, round, and reactive to light.  Pulmonary:     Effort: Pulmonary effort is normal.  Musculoskeletal:        General: Normal range of motion.  Neurological:     Mental Status: She is alert and oriented to person, place, and time.  Psychiatric:        Behavior: Behavior normal.        Judgment: Judgment normal.    Review of Systems  Psychiatric/Behavioral:  Positive for depression and memory loss. Negative for hallucinations, substance abuse and suicidal ideas. The patient is nervous/anxious. The patient does not have insomnia.   All other systems reviewed and are negative.  Blood pressure (!) 100/58, pulse 100, temperature 98.2 F (36.8 C), temperature source Oral, resp. rate 17, last menstrual period 01/27/2012, SpO2 98%. There is no height or weight on file to calculate BMI.  Musculoskeletal: Strength & Muscle Tone: within normal limits Gait & Station: normal Patient leans: N/A  [facility] MSE Discharge Disposition for Follow up and Recommendations: Based on my evaluation the patient does not appear to have an emergency medical condition and can be discharged with resources and follow up care in outpatient services for Medication Management and Individual Therapy  Provided with resources for outpatient therapy and medication management. Agreeable to calling [facility] [address] out[address]ient and  making an appointment with Dr. Curry for outpatient management. Also educated as follows: Get help right away if: You have thoughts about hurting yourself or others. Get help right away if you feel like you may hurt  yourself or others, or have thoughts about taking your own life. Go to your nearest emergency room or: Call 911. Call the National Suicide Prevention Lifeline at 671 299 4598 or 988 in the [place] This is open 24 hours a day. If you're a Veteran: Call 988 and press 1. This is open 24 hours a day. Text the [employer] at 775-710-5294. Summary Mental health is not just the absence of mental illness. It involves understanding your emotions and behaviors, and taking steps to manage them in a healthy way. If you have symptoms of mental or emotional distress, get help from family, friends, a health care provider, or a mental health professional. Practice good mental health behaviors such as stress management skills, self-calming skills, exercise, healthy sleeping and eating, and supportive relationships. This information is not intended to replace advice given to you by your health care provider. Make sure you discuss any questions you have with your health care provider.  Education provided on the fact that if experiencing worsening of psychiatry symptoms including suicidal ideations, homicidal ideations, or having auditory/visual hallucinations, etc, to call 911, 988, come back to this location,  or go to the nearest ER. Pt verbalized understanding.   Total Time Spent in Direct Patient Care:  I personally spent 60 minutes on the unit in direct patient care. The direct patient care time included face-to-face time with the patient, reviewing the patient's chart, communicating with other professionals, and coordinating care. Greater than 50% of this time was spent in counseling or coordinating care with the patient regarding goals of hospitalization, psycho-education, and discharge planning needs.   Donia Snell, NP 06/13/2024, 5:06 PM

## 2024-06-13 NOTE — Progress Notes (Signed)
   06/13/24 1319  [facility] Triage Screening (Walk-ins at [facility] only)  What Is the Reason for Your Visit/Call Today? Patient is a 58 year old female who presents voluntarily to[facility] accompanied by her husband  Brenda Martinez due to depression and anxiety. Patient's husband explained that patient had her first mental break back in June and spent some time at [facility]. He stated that yesterday he took her to the emergency room due to severe pain in her stomach. He stated that this morning she woke up saying  someone is that the door and they are coming to get me. Patient's husband stated that they got up went to the door and no one was there. Patient has been diagnosed with a UTI and has a catheter. Patient denies any alcohol or illicit substance use. She denies having any SI or HI. She endorses having occasional AVH. She reports having poor appetite feeling sad. She states that on a scale from 1-10 with 10 being extremely depressed she is currently at a 10. Patient was sitting calmly, she appeared to be somewhat disoriented.

## 2024-06-13 NOTE — Discharge Instructions (Addendum)

## 2024-06-14 ENCOUNTER — Other Ambulatory Visit: Payer: Self-pay

## 2024-06-14 ENCOUNTER — Ambulatory Visit (HOSPITAL_COMMUNITY)
Admission: EM | Admit: 2024-06-14 | Discharge: 2024-06-14 | Disposition: A | Discharge status: Home / Self Care | Attending: Family Medicine | Admitting: Family Medicine

## 2024-06-14 ENCOUNTER — Telehealth (HOSPITAL_COMMUNITY): Payer: Self-pay

## 2024-06-14 ENCOUNTER — Ambulatory Visit (HOSPITAL_BASED_OUTPATIENT_CLINIC_OR_DEPARTMENT_OTHER): Payer: Self-pay | Admitting: Family

## 2024-06-14 ENCOUNTER — Telehealth (HOSPITAL_COMMUNITY): Payer: Self-pay | Admitting: Family Medicine

## 2024-06-14 ENCOUNTER — Encounter (HOSPITAL_COMMUNITY): Payer: Self-pay | Admitting: Family

## 2024-06-14 VITALS — BP 116/70 | HR 87 | Ht 69.0 in | Wt 137.0 lb

## 2024-06-14 DIAGNOSIS — F339 Major depressive disorder, recurrent, unspecified: Secondary | ICD-10-CM | POA: Diagnosis not present

## 2024-06-14 DIAGNOSIS — F331 Major depressive disorder, recurrent, moderate: Secondary | ICD-10-CM

## 2024-06-14 DIAGNOSIS — R4189 Other symptoms and signs involving cognitive functions and awareness: Secondary | ICD-10-CM | POA: Diagnosis not present

## 2024-06-14 DIAGNOSIS — R410 Disorientation, unspecified: Secondary | ICD-10-CM

## 2024-06-14 DIAGNOSIS — R443 Hallucinations, unspecified: Secondary | ICD-10-CM

## 2024-06-14 DIAGNOSIS — R4689 Other symptoms and signs involving appearance and behavior: Secondary | ICD-10-CM

## 2024-06-14 DIAGNOSIS — Z9189 Other specified personal risk factors, not elsewhere classified: Secondary | ICD-10-CM | POA: Diagnosis not present

## 2024-06-14 NOTE — Progress Notes (Signed)
   06/14/24 0923  [facility] Triage Screening (Walk-ins at [facility] only)  What Is the Reason for Your Visit/Call Today? PT Brenda Martinez 57Y female presents to [facility] accompanied by her husband and son. PT states she is having trouble concentrating. PT appears spacey, not focused, withdrawn. PT states she has anxiety and takes medications. PT's husband states that pt was inconsolably crying this morning and last night. PT's husband states his wife has been on and off of so many different medications within the past few months. PT currently has a catheter and was recently diagnosed with a UTI. PT denies SI, HI, AVH and alcohol/substance use.  How Long Has This Been Causing You Problems? <Week  Have You Recently Had Any Thoughts About Hurting Yourself? No  Are You Planning to Commit Suicide/Harm Yourself At This time? No  Have you Recently Had Thoughts About Hurting Someone Sherral? No  Are You Planning To Harm Someone At This Time? No  Physical Abuse Denies  Verbal Abuse Denies  Sexual Abuse Denies  Exploitation of patient/patient's resources Denies  Self-Neglect Denies  Are you currently experiencing any auditory, visual or other hallucinations? Yes  Please explain the hallucinations you are currently experiencing: Visual, shadowy figures dancing, auditory - music, at home  Have You Used Any Alcohol or Drugs in the Past 24 Hours? No  Do you have any current medical co-morbidities that require immediate attention? No  Clinician description of patient physical appearance/behavior: depressed, flat, hard to concentrate  What Do You Feel Would Help You the Most Today? Treatment for Depression or other mood problem;Medication(s)  Determination of Need Urgent (48 hours)  Options For Referral [facility] Urgent Care;Facility-Based Crisis;Intensive Outpatient Therapy;Medication Management  Determination of Need filed? Yes

## 2024-06-14 NOTE — ED Provider Notes (Signed)
 Behavioral Health Urgent Care Medical Screening Exam  Patient Name: Brenda Martinez MRN: [id number] Date of Evaluation: 06/14/24 Chief Complaint:  Crying Diagnosis:  Final diagnoses:  Confusion and disorientation  Cognitive and behavioral changes  Episode of recurrent major depressive disorder, unspecified depression episode severity (HCC)  At risk for polypharmacy   History of Present illness: Brenda Martinez is a 58 y.o. female, interstitial cystitis, recurrent UTIs, MDD, GAD, recent cognitive and behavior changes. Patient presents voluntarily and is accompanied by her husband and her son.  Patient seen here at [facility] on yesterday and returns today after experiencing an episode of crying last night and another episode of inconsolable crying this morning and withdrawn behavior.  Patient was seen by another crisis provider on yesterday after she presented with similar course of symptoms.    Patient initially interviewed by herself however provides limited responses when she is asked questions. Patient endorses crying today however, denies crying episode is related to depression or anxiety. Patient rubs her lower abdomen and endorses experiencing abdominal pain last night and today. Denies that the pain is present right now. Patient is unclear about her medications and what she is taking and directs me to speak with her husband   Recent neurological changes: Per husband, since June patients overall mental state and cognition has progressively declined. Patient has been seen at the emergency department multiple times for abdominal pain, difficulty sleeping, confusion, syncopal episode in July which was attributed to possible UTI. Per spouse, patient has no significant family history of early onset neurocognitive disorders however patient's father passed away in his 24s and was diagnosed with a brief course of dementia. Patient has a follow-up with her primary care at the end of the week and  there is some discussion about a neurology referral versus a head CT.  Discussed with patient and spouse during today's interview a referral to neurology given patient has had significant decline over the last 3 months and is currently taking antidepressants along with medication for anxiety without any significant improvement in symptoms.  Discussed that further work up to rule out an organic neurological cause to patients presentation. Of note over the last 1-2 weeks, patient has voiced to husband episodes of visual hallucinations described as seeing people who are not there.  Flowsheet Row ED from 06/14/2024 in [facility] ED from 06/13/2024 in [facility] ED from 06/12/2024 in [facility] Emergency Department at [facility]  C-SSRS RISK CATEGORY High Risk No Risk No Risk    Psychiatric Specialty Exam  Presentation  General Appearance:Disheveled  Eye Contact:Fair  Speech:Clear and Coherent  Speech Volume:Decreased  Handedness:Right   Mood and Affect  Mood: Anxious; Depressed  Affect: Depressed   Thought Process  Thought Processes: Coherent  Descriptions of Associations:Intact  Orientation:Full (Time, Place and Person)  Thought Content:Logical    Hallucinations:None  Ideas of Reference:None  Suicidal Thoughts:No  Homicidal Thoughts:No   Sensorium  Memory: Immediate Fair  Judgment: Fair  Insight: Fair   Art therapist  Concentration: Fair  Attention Span: Fair  Recall: [employer] of Knowledge: Fair  Language: Fair   Psychomotor Activity  Psychomotor Activity: Normal   Assets  Assets: Resilience   Sleep  Sleep: Good  Number of hours:   Physical Exam: Physical Exam Constitutional:      General: She is not in acute distress.    Appearance: Normal appearance. She is not ill-appearing.  HENT:     Head:  Normocephalic and atraumatic.     Nose: Nose normal.   Eyes:     Extraocular Movements: Extraocular movements intact.     Conjunctiva/sclera: Conjunctivae normal.     Pupils: Pupils are equal, round, and reactive to light.  Cardiovascular:     Rate and Rhythm: Normal rate and regular rhythm.  Pulmonary:     Effort: Pulmonary effort is normal.     Breath sounds: Normal breath sounds.  Musculoskeletal:     Cervical back: Normal range of motion and neck supple.  Skin:    General: Skin is warm and dry.  Neurological:     General: No focal deficit present.     Mental Status: She is alert and oriented to person, place, and time.    Review of Systems  Psychiatric/Behavioral:  The patient is nervous/anxious.      Blood pressure 104/70, pulse 92, temperature 98.2 F (36.8 C), temperature source Temporal, resp. rate 15, last menstrual period 01/27/2012, SpO2 98%. There is no height or weight on file to calculate BMI.  Musculoskeletal: Strength & Muscle Tone: decreased Gait & Station: Gait is slow, independently ambulates  Patient leans: N/A   [facility] MSE Discharge Disposition for Follow up and Recommendations: Based on my evaluation the patient does not appear to have an emergency medical condition and can be discharged with resources and follow up care in outpatient services for : Given patient acute cognitive decline with new onset visual hallucinations,withdrawn behavior w/solmonence -patient warrants a full neurological work up to rule out an organic neurological cause for rapid change in over all functionality and ADLs. Patient referred to [facility] for further workup and evaluation.  Today patient is scheduled to see an outpatient provider at [facility] behavioral health outpatient clinic today. Patient is currently taking several medications for mental health however currently does not have a psychiatrist.   - Patient and spouse advised that [facility] neurology will call to schedule an appointment.   [name] Lesches, NP 06/14/2024,  10:31 AM

## 2024-06-14 NOTE — Telephone Encounter (Signed)
 8:15am 06/14/24 Pt's husband called requesting an appointment with Dr. Curry today - explained to the husband currently Dr. Arfeen is not taking any new patients. The husband stated that his wife went to Urgent Care on [address] on 06/13/24 and was told to call this morning for an appt.  The husband stated that his wife is sitting on the edge of the sofa rocking back/forth and not really communicating - suggested to the husband he could either take her back to Urgent Care or [facility] on [address] the patient's husband stated that he didn't want her to go back to [facility], I then suggested Saturday clinic, but the husband wanted her to be seen before Saturday. At this point I called Elouise Birmingham (Clinical Nurse Manager ) for more advise, Shawn reviewed the notes from 09/01 and then proceeded to speak with the husband on the phone.

## 2024-06-14 NOTE — Telephone Encounter (Signed)
 Advice only - Telephone call with pt's husband stating they had been to the [facility] the previous day and wanted to get his wife in as soon as possible due to her continued crying and now not speaking to him or responding, only sitting on their couch crying. Questioned who patient was currently seeing for psychiatric care and medications as collateral reported after her hospitalization in June 2025 to [facility] patient had all types of issues that her primary care had to take her off most medications started then, to treat urology issues and collateral stated I do not want them to take her back there, referring to the [facility] hospital.  Collateral explained that patient needed something immediately as was going to be worked in to see a NP at [employer] office later today. Collateral, after some discussion agreed to  assist with current crisis, it would be best to take patient back to the [facility] for an emergent evaluation as he reported thinking she may need to be hospitalized again but does not want her to go to [facility] hospital.  Collateral agreed to discuss this with the provider that evaluates patient back at the [facility] today and will call our office back later to see up a future follow up appointment for patient.

## 2024-06-14 NOTE — Progress Notes (Unsigned)
 Psychiatric Initial Adult Assessment   Patient Identification: Brenda Martinez MRN:  [id number] Date of Evaluation:  06/15/2024 Referral Source: [facility] urgent care Chief Complaint: Worsening depression, paranoia and irrational thinking  Visit Diagnosis:    ICD-10-CM   1. Major depressive disorder, recurrent episode, moderate (HCC)  F33.1     2. Hallucination  R44.3       History of Present Illness: Brenda Martinez 58 year old Caucasian female presents to establish care.  She was referred by [facility] urgent care facility.  Patient seen and evaluated face-to-face.  Patient presents accompanied by her husband.  He reports concerns related to mood changes, memory problems, poor concentration, paranoia anxiety and depression.  Reports his wife is not functioning but she is having multiple crying spells throughout the day.  Patient was recently seen and evaluated due to confusion and disorientation.  Was reported that patient was diagnosed with interstitial cystitis /urinary retention issues.  Currently followed by urology.  Currently prescribed Keflex  for reported symptoms.  Martinez provided by significant other.Brenda Martinez) who reports she is was followed by psychiatrist Brenda Martinez, where she was prescribed bupropion, sertraline, lorazepam , amitriptyline currently taking gabapentin.  States she was hospitalized this past June/2025 where she was placed on Effexor  and Risperdal. Reports Brenda Martinez has since tapered off of the medication.  Stated that medication was too high of a dose.   Brenda Martinez reports he and the primary care Brenda Martinez have decided to initiate a taper on her Wellbutrin, lorazepam  and Zoloft.  States she continues to take amitriptyline 50 mg.  He has concerns related to serotonin syndrome.  Patient was recently seen and evaluated at [facility] urgent care facility by multiple providers.  Brenda Martinez.  As listed below.  Reports he was  encouraged to follow-up with neurology and urology which he reports he has a follow-up with appointment on 9/3 for urology.    - Family has declined inpatient admission with [facility] due to previous experience.  - He reported will attempt to follow-up with [facility]  As documented in previous assessment:Husband reported that the patient is on the following medications: Amitriptyline, stated he is unsure of the strength, but that she takes 1 pill nightly for sleep, states that it is also helping with her interstitial cystitis.  Reports that patient takes Wellbutrin 150 mg, states that primary care provider is in the process of tapering down this medication.  Also states that patient takes Ativan  1 mg 3 times daily, but is currently taking 1 mg in the morning, 0.5 mg 3 times daily, at the order of the PCP as well.  He reports that patient also takes Zoloft previously was 200 mg but currently taking 150 mg daily also being tapered by PCP.  Husband reported that patient was recently put on gabapentin, he is unsure of the strength but states that he administers all of patient's medications and follows directions on the bottles due to a recent overdose which she states was accidental in nature, whereby patient took 5 pills of the amitriptyline at 1 time, and ended up in the ER.  He reports that this happenned in June of this year.  He reports that most recently, tamsulosin  was also added to the patient's medication regimen. Patient reported depressive symptoms including anhedonia, but reports that her sleep quality has been good recently, she reports feelings of guilt regarding not being able to take care of her 45 year old disabled son as well as she wants to.  She  reports a decreased energy level which has been ongoing for a while now, reports persistent episodes of disorientation, where she is very forgetful with memory lapses reports that her appetite is stable, reports poor  concentration levels, describes symptoms consistent with psychomotor retardation.  Also describes symptoms consistent with GAD; restlessness, persistent worrying, muscle tension reports that the above symptoms have been ongoing for at least the past 6 months now.  Denies any other symptoms consistent with any other mental health conditions.  Denies substance use.   Reports patient has tried multiple medication in the past to include Abilify , Effexor , Risperdal, Cymbalta.   Brenda Martinez is oriented to self and current events only. presents apathetic, masslike facial features.  Patient semiengaged throughout conversation slightly reactive to conversation and questions.  Denied suicidal or homicidal ideations.  Does report auditory visual hallucinations.  States voices are intermittent denied command in nature.  Husband, does report possible suicide attempt with taking too many amitriptyline due to pain few months prior. (Unintentional) reports he now in control of medications.   Associated Signs/Symptoms: Depression Symptoms:  depressed mood, psychomotor agitation, anxiety, (Hypo) Manic Symptoms:  Distractibility, Anxiety Symptoms:  Excessive Worry, Psychotic Symptoms:  Hallucinations: Auditory Visual Paranoia, reports seeing shadows and has concerns related to home water being poisoned.  PTSD Symptoms: NA  Past Psychiatric History:  Documented per previous inpatient admission:Medical Diagnoses: HTN Home Rx: rosuvastatin  Prior Hosp: Prior Surgeries/Trauma: 2x c-sections, abdominal hernia repair Head trauma, LOC, concussions, seizures: Denies Allergies: Denies LMP: postmenopausal PCP: Brenda Martinez, [phone]  Previous Psychotropic Medications: Yes  Pulled from inpatient admission assessment note 6/14 Previous Psych Diagnoses: GAD, MDD Prior inpatient treatment: No  Current/prior outpatient treatment: Yes, Brenda Martinez Prior rehab hx: denies Psychotherapy hx: denies History of  suicide: denies History of homicide or aggression: denies Psychiatric medication history: escitalopram, buproprion 150, vilazodone, sertraline, esketamine, buspirone, duloxetine, hydroxyzine , lorazepam  Psychiatric medication compliance history: good Neuromodulation history: denies Current Psychiatrist:Phone: [phone] Fax [phone] Emilio Vincente, MD Current therapist: na  Substance Abuse History in the last 12 months:  No.  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 Psych: Sister with severe anxiety. Maternal grandmother had bipolar disorder Psych Rx: Duloxetine 30 mg for sister seems to help SA/HA: denies Substance use family hx: denies did report.  Father diagnosed with dementia  Family History:  Family History  Problem Relation Age of Onset   Heart disease Father        CABG   Heart attack Maternal Grandfather    Heart attack Paternal Grandfather     Social History:   Social History   Socioeconomic History   Marital status: Married    Spouse name: Not on file   Number of children: Not on file   Years of education: Not on file   Highest education level: Not on file  Occupational History   Not on file  Tobacco Use   Smoking status: Never   Smokeless tobacco: Never  Vaping Use   Vaping status: Never Used  Substance and Sexual Activity   Alcohol use: No   Drug use: No  Sexual activity: Yes    Birth control/protection: Pill  Other Topics Concern   Not on file  Social History Narrative   Not on file   Social Drivers of Health   Financial Resource Strain: Not on file  Food  Insecurity: No Food Insecurity (03/21/2024)   Hunger Vital Sign    Worried About Running Out of Food in the Last Year: Never true    Ran Out of Food in the Last Year: Never true  Transportation Needs: No Transportation Needs (03/21/2024)   PRAPARE - Administrator, Civil Service (Medical): No    Lack of Transportation (Non-Medical): No  Physical Activity: Not on file  Stress: Not on file  Social Connections: Not on file    Additional Social History:   Allergies:  No Known Allergies  Metabolic Disorder Labs: Lab Results  Component Value Date   HGBA1C 5.4 03/25/2024   MPG 108.28 03/25/2024   No results found for: PROLACTIN Lab Results  Component Value Date   CHOL 199 03/21/2024   TRIG 50 03/21/2024   HDL 73 03/21/2024   CHOLHDL 2.7 03/21/2024   VLDL 10 03/21/2024   LDLCALC 116 (H) 03/21/2024   Lab Results  Component Value Date   TSH 0.394 03/21/2024    Therapeutic Level Labs: No results found for: LITHIUM No results found for: CBMZ No results found for: VALPROATE  Current Medications: Current Outpatient Medications  Medication Sig Dispense Refill   amitriptyline (ELAVIL) 50 MG tablet Take 50-100 mg by mouth at bedtime.     buPROPion (WELLBUTRIN XL) 150 MG 24 hr tablet Take 150 mg by mouth every morning.     LORazepam  (ATIVAN ) 1 MG tablet Take 1 mg by mouth 3 (three) times daily.     Meth-Hyo-M Bl-Na Phos-Ph Sal (URO-SP) 118 MG CAPS Take 1 capsule by mouth 3 (three) times a day as needed (bladder spasm/ pain). 30 capsule 3   senna-docusate (SENOKOT-S) 8.6-50 MG tablet Take 1 tablet by mouth 2 (two) times daily as needed for mild constipation. 30 tablet 0   sertraline (ZOLOFT) 50 MG tablet Take 100 mg by mouth daily.     cephALEXin  (KEFLEX ) 500 MG capsule Take 1 capsule (500 mg total) by mouth 2 (two) times daily for 7 days. (Patient not taking: Reported on 06/14/2024) 14 capsule 0   hydrOXYzine  (ATARAX ) 25 MG tablet Take 2 tablets (50 mg total) by mouth  every 8 (eight) hours as needed for anxiety. Take 1-2 tablets by mouth up to every 8 (eight) hours as needed for anxiety. (Patient not taking: Reported on 04/18/2024) 60 tablet 0   Multiple Vitamins-Minerals (MULTIVITAMIN WOMEN 50+) TABS Take 1 tablet by mouth daily. (Patient not taking: Reported on 06/14/2024)     omeprazole (PRILOSEC) 40 MG capsule Take 40 mg by mouth daily. (Patient not taking: Reported on 06/14/2024)     risperiDONE (RISPERDAL) 1 MG tablet Take 1 mg by mouth See admin instructions. Take 1 tablet (1mg ) by mouth at bedtime. May take an additional tablet later in the night if still awake. (Patient not taking: Reported on 06/14/2024)     tamsulosin  (FLOMAX ) 0.4 MG CAPS capsule Take 1 capsule (0.4 mg total) by mouth daily for 14 days. 14 capsule 0   venlafaxine  XR (EFFEXOR -XR) 37.5 MG 24 hr capsule Take 3 capsules (112.5 mg total) by mouth daily with breakfast. (Patient not taking: Reported on 06/14/2024) 90 capsule 0   No current facility-administered medications for this visit.    Musculoskeletal:  Psychiatric Specialty Exam: Review of Systems  Blood pressure 116/70, pulse 87, height 5' 9 (1.753 m), weight 137 lb (62.1 kg), last menstrual period 01/27/2012.Body mass index is 20.23 kg/m.  General Appearance: Guarded  Eye Contact:  Minimal  Speech:  Clear and Coherent and Slow  Volume:  Decreased  Mood:  Depressed  Affect:  Depressed and Flat  Thought Process:  Linear  Orientation:  Other:  Person and current events  Thought Content:  Paranoid Ideation and Rumination  Suicidal Thoughts:  No  Homicidal Thoughts:  No  Memory:  Immediate;   Poor Recent;   Poor  Judgement:  Poor  Insight:  Lacking  Psychomotor Activity:  Normal  Concentration:  Concentration: Poor  Recall:  Poor  Fund of Knowledge:Fair  Language: Fair  Akathisia:  No  Handed:  Right  AIMS (if indicated):  not done  Assets:  Communication Skills Desire for Improvement  ADL's:  Intact  Cognition:  Impaired,  Mild  Sleep:  Poor   Screenings: AUDIT    Flowsheet Row [facility]  Alcohol Use Disorder Identification Test Final Score (AUDIT) 0   Flowsheet Row ED from 06/14/2024 in [facility] ED from 06/13/2024 in [facility] ED from 06/12/2024 in [facility]  C-SSRS RISK CATEGORY High Risk No Risk No Risk    Assessment and Plan: Dulcie Gammon 58 year old female presents to establish care.  Patient presents with her husband who provided most to patient's historical data.  Recommendation to follow-up with geropsych inpatient admission.  Due to multiple outside concerning factors related to medical decompensation. Husband reports he/primary care has attempted to taper medication and patient had follow-up with Urology on 9/3 and PCP later this week.   Husband stated that patient may have a thyroid  disorder.   Collaboration of Care: Psychiatrist AEB consideration with geropsychiatry is at discharge from inpatient admission  Patient/Guardian was advised Release of Martinez must be obtained prior to any record release in order to collaborate their care with an outside provider. Patient/Guardian was advised if they have not already done so to contact the registration department to sign all necessary forms in order for us  to release Martinez regarding their care.   Consent: Patient/Guardian gives verbal consent for treatment and assignment of benefits for services provided during this visit. Patient/Guardian expressed understanding and agreed to proceed.   Staci LOISE Kerns, NP 9/3/20258:20 AM

## 2024-06-14 NOTE — Discharge Instructions (Addendum)
 Arrive at [facility] no later than 1:30 pm to complete registration. Referral placed to [facility]. Their office will call you to schedule appointment.

## 2024-06-15 NOTE — Progress Notes (Signed)
-------------------------------------------------------------------------------   Attestation signed by Comer Anette Drone, MD at 06/15/2024  4:58 PM I spoke with Earnie about this patient. Agree with assessment to not do void trial/ CIC teaching given patient affect changes Will evaluate in person on Friday Pt with significant history of anxiety and depression  Comer LITTIE Drone, MD   Attending Physician 4:58 PM Wed 06/15/2024  -------------------------------------------------------------------------------  Patient arrived with her husband mostly non verbal with flat affect, unable to answer simple questions, with teaching CIC not completed. Patient's husband stating patient started 2 new medications at LOV with Dr. Drone on 06/10/24, Gabapentin & Methen-blue r/t IC pain that had been 10 / 10 since June 2025. Patient's husband stated her IC pain is now 1/10, they have stopped the Methen Blue and continue with the Gabapentin and previously RX for amitriptyline. Patient's husband stated her affect changed since starting new medication. This nurse called Dr. Drone with patient double booked on Friday 06/17/24 before noon. Catheter left in place with leg bag attached with overnight bag given to husband with education completed.

## 2024-06-16 ENCOUNTER — Telehealth: Payer: Self-pay | Admitting: Neurology

## 2024-06-16 ENCOUNTER — Ambulatory Visit (INDEPENDENT_AMBULATORY_CARE_PROVIDER_SITE_OTHER): Payer: Self-pay | Admitting: Neurology

## 2024-06-16 VITALS — BP 94/59 | HR 97 | Ht 69.0 in | Wt 135.0 lb

## 2024-06-16 DIAGNOSIS — R4182 Altered mental status, unspecified: Secondary | ICD-10-CM | POA: Diagnosis not present

## 2024-06-16 DIAGNOSIS — R4689 Other symptoms and signs involving appearance and behavior: Secondary | ICD-10-CM | POA: Diagnosis not present

## 2024-06-16 DIAGNOSIS — G9340 Encephalopathy, unspecified: Secondary | ICD-10-CM

## 2024-06-16 DIAGNOSIS — R4189 Other symptoms and signs involving cognitive functions and awareness: Secondary | ICD-10-CM

## 2024-06-16 NOTE — Progress Notes (Addendum)
 Subjective:    Patient ID: Brenda Martinez is a 58 y.o. female.  HPI    True Mar, MD, PhD [facility] [address], [address] [address] [place], [place] [address]  Dear Brenda Martinez,  I saw your patient, Brenda Martinez, upon your kind request in my neurologic clinic today for evaluation of her mental status changes.  The patient is accompanied by her husband and her 82 year old son (with special needs, developmental delay, does not contribute to history) today.  Brenda Martinez is a 58 year old female with an underlying medical history of hyperlipidemia, depression, anxiety, recurrent UTIs, and interstitial cystitis, who reports very little of her own history, she is unable to provide details.  She reports that she has been all over the place and acting schizo.  Her husband provides her history and reports that 7 to 10 days ago she had an acute change in her behavior and mental state.  She became withdrawn, had crying spells, started seeing things that were not real and became paranoid, afraid that somebody would come in the house to take her away and that her son would fall down the stairs.  She has a longstanding history of anxiety and depression and has had multiple medication changes through her PCP, just today or yesterday they were advised to stop the amitriptyline.  Patient has been on Risperdal before, she has been on hydroxyzine  before, she has been on amitriptyline, currently 50 mg once daily but husband also adds that she has taken too much amitriptyline about a month ago which was deemed an accidental overdose, she apparently took 5 pills in 1 day instead of 1 pill. Patient has not had any sudden onset of one-sided weakness, denies any pain, she has not fallen or hit her head.  She does not have a history of seizures or epilepsy.  She has seen urology for her interstitial cystitis and currently has an indwelling catheter.  She has an appointment with urology tomorrow.   Currently she is on amitriptyline, Wellbutrin, lorazepam , sertraline, and gabapentin.  She is no longer on Effexor , Flomax , Risperdal, hydroxyzine , and is supposed to come off of amitriptyline.  She presented to behavioral health urgent care on 06/14/2024 with changes in her cognitive functioning and behavioral changes per family.  Family reported decline in mental state over the past 3 months.  Of note, she has presented to the emergency room several times in the past few months.  She has had visual hallucinations.  She was noted to be nervous and anxious.  She was reportedly withdrawn and somnolent.  She was advised to follow-up with behavioral health on an outpatient basis.  She has had some laboratory testing in the recent past, including laboratory testing in early June with TSH, lipid panel, CMP and lipase, hemoglobin A1c was tested on 03/25/2024 and was 5.4.  Ethanol level in July 2025 was less than 15, acetaminophen  level on 05/06/2024 was less than 10, salicylate level on 05/06/2024 was less than 7, beta-hCG and serum in July 2025 was positive.  UDS on 05/06/2024 positive for benzodiazepines.  She had a transvaginal ultrasound on 05/06/2024 with no intrauterine pregnancy visualized.  Close follow-up was recommended due to positive beta hCG.  She had a CT scan of the abdomen and pelvis without contrast on 06/03/2024 and I reviewed the results:  IMPRESSION: 1. Markedly distended urinary bladder. 2. Aortic atherosclerosis.   She has not had any brain imaging scan. She was felt appropriate for discharge, she has not  been herself in about 7 to 10 days and has been advised to follow-up as an outpatient with psychiatry, they do not have an appointment quite yet.  I had evaluated her for sleep apnea concern in the past.  She did not pursue sleep testing at the time.  She had seen psychiatry in the past.  Previously:  01/19/2020: 58 year old right-handed woman with an underlying medical history of  hyperlipidemia, depression, anxiety, reflux disease, and mildly overweight state, who reports snoring and excessive daytime somnolence.  I reviewed your office note from 12/30/2019.  Her Epworth sleepiness score is 6 out of 24, fatigue severity score is 18 out of 63.  She does not wake up rested.  She has had occasional dull bifrontal headaches in the mornings.  She does not have night to night nocturia.  One of her sisters had a sleep study but no one has been diagnosed with obstructive sleep apnea.  She has started snoring the past year or so.  Her husband has noticed that and it bothers him from time to time.  For her mood, she has been on Wellbutrin and Lexapro, stable doses and she is followed by Dr. Vincente.  She is currently not working.  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  She is a non-smoker, drinks alcohol occasionally, caffeine in the form of coffee, about 2 cups/day in the mornings.  She denies any telltale symptoms of restless leg syndrome but had severe discomfort in the left foot and leg from flareup of plantar fasciitis in the recent past.  She does not watch TV in the bedroom.  Bedtime is typically around 11 and rise time around 7:30 AM.  She lives with her husband and younger of 2 sons, age 68, she has a 76 year old son as well.  They have 1 dog in the household, he typically sleeps in their bedroom on the floor.     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    HYPERLIPIDEMIA    Other anxiety states     Her Past Surgical History Is Significant For: Past Surgical History:  Procedure Laterality Date   CESAREAN SECTION      Her Family History Is Significant For: Family History  Problem Relation Age of Onset   Heart disease Father        CABG   Heart attack Maternal Grandfather    Heart attack Paternal Grandfather     Her Social History Is  Significant For: Social History   Socioeconomic History   Marital status: Married    Spouse name: Not on file   Number of children: Not on file   Years of education: Not on file   Highest education level: Not on file  Occupational History   Not on file  Tobacco Use   Smoking status: Never   Smokeless tobacco: Never  Vaping Use   Vaping status: Never Used  Substance and Sexual Activity   Alcohol use: No   Drug use: No   Sexual activity: Yes    Birth control/protection: Pill  Other Topics Concern   Not on file  Social History Narrative   Not on file   Social Drivers of Health   Financial Resource Strain: Not on file  Food Insecurity: No Food Insecurity (03/21/2024)   Hunger Vital Sign    Worried About Running Out  of Food in the Last Year: Never true    Ran Out of Food in the Last Year: Never true  Transportation Needs: No Transportation Needs (03/21/2024)   PRAPARE - Administrator, Civil Service (Medical): No    Lack of Transportation (Non-Medical): No  Physical Activity: Not on file  Stress: Not on file  Social Connections: Not on file    Her Allergies Are:  No Known Allergies:   Her Current Medications Are:  Outpatient Encounter Medications as of 06/16/2024  Medication Sig   amitriptyline (ELAVIL) 50 MG tablet Take 50 mg by mouth at bedtime.   buPROPion (WELLBUTRIN XL) 150 MG 24 hr tablet Take 150 mg by mouth every evening.   gabapentin (NEURONTIN) 300 MG capsule Take 300 mg by mouth at bedtime.   LORazepam  (ATIVAN ) 1 MG tablet Take 1 mg by mouth 3 (three) times daily. (Patient taking differently: Take 1 mg by mouth 3 (three) times daily. Taking 1 tablet in AM, 1/2 tablet at noon, 1 tablet in PM.)   Multiple Vitamins-Minerals (MULTIVITAMIN WOMEN 50+) TABS Take 1 tablet by mouth daily.   senna-docusate (SENOKOT-S) 8.6-50 MG tablet Take 1 tablet by mouth 2 (two) times daily as needed for mild constipation.   sertraline (ZOLOFT) 100 MG tablet Take 175 mg by  mouth daily.   tamsulosin  (FLOMAX ) 0.4 MG CAPS capsule Take 1 capsule (0.4 mg total) by mouth daily for 14 days.   [DISCONTINUED] hydrOXYzine  (ATARAX ) 25 MG tablet Take 2 tablets (50 mg total) by mouth every 8 (eight) hours as needed for anxiety. Take 1-2 tablets by mouth up to every 8 (eight) hours as needed for anxiety. (Patient not taking: Reported on 04/18/2024)   [DISCONTINUED] Meth-Hyo-M Bl-Na Phos-Ph Sal (URO-SP) 118 MG CAPS Take 1 capsule by mouth 3 (three) times a day as needed (bladder spasm/ pain).   [DISCONTINUED] omeprazole (PRILOSEC) 40 MG capsule Take 40 mg by mouth daily. (Patient not taking: Reported on 06/14/2024)   [DISCONTINUED] risperiDONE (RISPERDAL) 1 MG tablet Take 1 mg by mouth See admin instructions. Take 1 tablet (1mg ) by mouth at bedtime. May take an additional tablet later in the night if still awake. (Patient not taking: Reported on 06/14/2024)   [DISCONTINUED] sertraline (ZOLOFT) 50 MG tablet Take 100 mg by mouth daily.   [DISCONTINUED] venlafaxine  XR (EFFEXOR -XR) 37.5 MG 24 hr capsule Take 3 capsules (112.5 mg total) by mouth daily with breakfast. (Patient not taking: Reported on 06/14/2024)   No facility-administered encounter medications on file as of 06/16/2024.  :   Review of Systems:  Out of a complete 14 point review of systems, all are reviewed and negative with the exception of these symptoms as listed below:  Review of Systems  Neurological:        Patient presents today with her family to discuss an acute mental status and personality change. Patient has had multiple recent behavioral health visits. Family reports that she is sometimes unable to hold a conversation. She has developed a soft tone.     Objective:  Neurological Exam  Physical Exam Physical Examination:   Vitals:   06/16/24 1451  BP: (!) 94/59  Pulse: 97    General Examination: The patient is a 58 year old female in no apparent distress, she is quiet, minimally verbal, significant  psychomotor retardation, very scant speech but no obvious dysarthria.   HEENT: Normocephalic, atraumatic, pupils are equal, round and reactive to light, corrective eyeglasses in place, face is symmetric, no significant changes in facial  animation, extraocular tracking is good without limitation to gaze excursion or nystagmus noted. Hearing is grossly intact. Face is symmetric with normal facial animation. Speech is soft and scant, no dysarthria.  No voice tremor, no lip or jaw tremor with the exception of intermittent lower lip tremor.  Neck is supple with full range of passive and active motion. There are no carotid bruits on auscultation. Oropharynx exam reveals: moderate mouth dryness, adequate dental hygiene.  Tongue protrudes centrally and palate elevates symmetrically.   Chest: Clear to auscultation without wheezing, rhonchi or crackles noted.  Heart: S1+S2+0, regular and normal without murmurs, rubs or gallops noted.   Abdomen: Soft, non-tender and non-distended.  Extremities: There is no pitting edema in the distal lower extremities bilaterally.   Skin: Warm and dry without trophic changes noted.   Musculoskeletal: exam reveals no obvious joint deformities.   Neurologically:  Mental status: The patient is awake, does not pay full attention, is not able to answer questions appropriately, limited answers were given and could not answer questions to address her capability to take care of her ADLs, husband filled out those questions.  Affect is flat, psychomotor retardation is noted.      06/16/2024    2:59 PM  MMSE - Mini Mental State Exam  Orientation to time 3  Orientation to Place 4  Registration 3  Attention/ Calculation 2  Recall 2  Language- name 2 objects 2  Language- repeat 1  Language- follow 3 step command 2  Language- read & follow direction 1  Write a sentence 1  Copy design 0  Total score 21   Cranial nerves II - XII are as described above under HEENT exam.   Motor exam: Normal bulk, strength and tone is noted. There is no obvious action or resting tremor.  Fine motor skills and coordination: grossly intact.  Cerebellar testing: No dysmetria or intention tremor. There is no truncal or gait ataxia.  Sensory exam: intact to light touch in the upper and lower extremities.  Gait, station and balance: She stands slowly, she walks slowly, no shuffling noted.    Assessment and Plan:  In summary, VELINA DROLLINGER is a very pleasant 58 y.o.-year old female 58 year old female with an underlying medical history of hyperlipidemia, depression, anxiety, recurrent UTIs, and interstitial cystitis, who presents for evaluation of acute personality, behavioral and mental status changes over the past 7 to 10 days.  I am somewhat surprised that she was deemed fit to be worked up as an outpatient.  She is limited in her ability to answer questions or follow verbal instructions.  Significant psychomotor retardation is noted, no obvious neurological focal abnormalities noted.  I think she needs inpatient workup and I am not quite sure why she deemed fit for an outpatient referral to neurology.  Nevertheless, differential diagnosis is vast in this case and includes metabolic encephalopathy including secondary to toxin or infectious agent or electrolyte disturbance, electrographic status epilepticus, withdrawal, overdose, to name a few possibilities.  Patient's husband is advised to take her to the emergency room for immediate evaluation.  I have ordered some tests, we can do blood work on her way out and I would recommend in the least evaluation for metabolic encephalopathy to rule out an organic cause of her symptoms and close psychiatric involvement including possibility of inpatient behavioral stay if organic cause is excluded.  I would recommend a brain MRI and an EEG.    Below is a summary of my recommendations and our  discussion points from today's visit, based on chart review,  history and examination. They were given these instructions verbally during the visit in detail and also in writing in the MyChart after visit summary (AVS), which they can access electronically.   <<I recommend you go to the ER directly from here. I strongly believe, you need an urgent/inpatient work up to rule out an organic cause of your behavioral and mental state changes. Causes may include: electrical seizures, medication overdose, acute psychosis, severe depression, infection, liver dysfunction. We will check blood work today and call you with the test results. We will do a brain scan, called MRI and call you with the test results. We will have to schedule you for this on a separate date. This test requires authorization from your insurance, and we will take care of the insurance process. We will do an EEG (brainwave test), which we will schedule. We will call you with the results. >>   This was an extended visit of over 70 minutes with high complexity, copious record review involved in considerable counseling and coordination of care.  True Mar, MD, PhD

## 2024-06-16 NOTE — Telephone Encounter (Signed)
 sent to GI as urgent, they obtain [place]. [phone]

## 2024-06-16 NOTE — Patient Instructions (Signed)
 I recommend you go to the ER directly from here. I strongly believe, you need an urgent/inpatient work up to rule out an organic cause of your behavioral and mental state changes. Causes may include: electrical seizures, medication overdose, acute psychosis, severe depression, infection, liver dysfunction. We will check blood work today and call you with the test results. We will do a brain scan, called MRI and call you with the test results. We will have to schedule you for this on a separate date. This test requires authorization from your insurance, and we will take care of the insurance process. We will do an EEG (brainwave test), which we will schedule. We will call you with the results.

## 2024-06-19 LAB — VITAMIN D 25 HYDROXY (VIT D DEFICIENCY, FRACTURES): Vit D, 25-Hydroxy: 61.9 ng/mL (ref 30.0–100.0)

## 2024-06-19 LAB — AMMONIA: Ammonia: 25 ug/dL — ABNORMAL LOW (ref 34–178)

## 2024-06-19 LAB — VITAMIN B1: Thiamine: 97.4 nmol/L (ref 66.5–200.0)

## 2024-06-19 LAB — TSH: TSH: 0.975 u[IU]/mL (ref 0.450–4.500)

## 2024-06-19 LAB — RPR: RPR Ser Ql: NONREACTIVE

## 2024-06-19 LAB — HGB A1C W/O EAG: Hgb A1c MFr Bld: 5.3 % (ref 4.8–5.6)

## 2024-06-19 LAB — ANA W/REFLEX: Anti Nuclear Antibody (ANA): NEGATIVE

## 2024-06-20 ENCOUNTER — Telehealth: Payer: Self-pay | Admitting: Neurology

## 2024-06-20 ENCOUNTER — Ambulatory Visit: Payer: Self-pay | Admitting: Neurology

## 2024-06-20 NOTE — Telephone Encounter (Signed)
 Patient's husband said need to cancel EEG appt due to patient has been admitted to hospital due to psychosis issues.

## 2024-06-21 NOTE — Telephone Encounter (Signed)
 Called and spoke to pts husband who stated that the pt has actually been admitted to behavioral health with [facility]. They are unable to schedule eeg until released and doesn't expect that she will be out anytime soon.

## 2024-06-21 NOTE — Telephone Encounter (Signed)
-----   Message from True Mar sent at 06/20/2024  7:57 AM EDT ----- Please advise patient's husband that her labs were benign.  I had recommended they proceed to the emergency room after the clinic visit. I do not see any ER records, like I said, if there is concerns  for acute mental status changes, she has to go to the emergency room to get evaluated more urgently.  I also do not see where they had scheduled the EEG on the way out after the appointment.  Please  advise them to schedule the EEG.  ----- Message ----- From: Interface, [facility] Lab Results In Sent: 06/17/2024   7:38 AM EDT To: True Mar, MD

## 2024-06-21 NOTE — Telephone Encounter (Signed)
 Thanks for the update, I could not see that in her electronic chart when I sent the result note, maybe there was a delay in connecting to care everywhere.  Records did not show up until later.  I reviewed her chart including her ED records from [facility] and current behavioral admission records available.  Nothing further needed at this time.

## 2024-06-24 ENCOUNTER — Encounter: Payer: Self-pay | Admitting: Neurology

## 2024-07-04 ENCOUNTER — Ambulatory Visit (HOSPITAL_COMMUNITY): Payer: Self-pay | Admitting: Family
